# Patient Record
Sex: Female | Born: 1960 | Hispanic: No | Smoking: Never smoker
Health system: Southern US, Community
[De-identification: ages and names within clinical notes are randomized; demographics above are authoritative.]

## PROBLEM LIST (undated history)

## (undated) DIAGNOSIS — Z8619 Personal history of other infectious and parasitic diseases: Secondary | ICD-10-CM

## (undated) DIAGNOSIS — T7840XA Allergy, unspecified, initial encounter: Secondary | ICD-10-CM

## (undated) DIAGNOSIS — K635 Polyp of colon: Secondary | ICD-10-CM

## (undated) HISTORY — DX: Polyp of colon: K63.5

## (undated) HISTORY — DX: Personal history of other infectious and parasitic diseases: Z86.19

## (undated) HISTORY — PX: WISDOM TOOTH EXTRACTION: SHX21

## (undated) HISTORY — PX: ROBOTIC ASSISTED LAPAROSCOPIC HYSTERECTOMY AND SALPINGECTOMY: SHX6379

## (undated) HISTORY — PX: UMBILICAL HERNIA REPAIR: SHX196

## (undated) HISTORY — PX: HERNIA REPAIR: SHX51

## (undated) HISTORY — PX: ABDOMINAL HYSTERECTOMY: SHX81

## (undated) HISTORY — DX: Allergy, unspecified, initial encounter: T78.40XA

---

## 2012-12-23 LAB — HM COLONOSCOPY

## 2015-04-17 LAB — HM MAMMOGRAPHY

## 2017-10-19 ENCOUNTER — Other Ambulatory Visit: Payer: Self-pay

## 2017-10-19 ENCOUNTER — Encounter: Payer: Self-pay | Admitting: Family Medicine

## 2017-10-19 ENCOUNTER — Ambulatory Visit: Payer: BC Managed Care – PPO | Admitting: Family Medicine

## 2017-10-19 VITALS — BP 108/78 | HR 86 | Temp 98.1°F | Resp 14 | Ht 67.0 in | Wt 173.0 lb

## 2017-10-19 DIAGNOSIS — M79671 Pain in right foot: Secondary | ICD-10-CM | POA: Diagnosis not present

## 2017-10-19 DIAGNOSIS — R5383 Other fatigue: Secondary | ICD-10-CM

## 2017-10-19 DIAGNOSIS — M79672 Pain in left foot: Secondary | ICD-10-CM | POA: Diagnosis not present

## 2017-10-19 LAB — CBC WITH DIFFERENTIAL/PLATELET
BASOS ABS: 0 10*3/uL (ref 0.0–0.1)
Basophils Relative: 0.9 % (ref 0.0–3.0)
EOS ABS: 0.2 10*3/uL (ref 0.0–0.7)
Eosinophils Relative: 4.5 % (ref 0.0–5.0)
HCT: 40.6 % (ref 36.0–46.0)
Hemoglobin: 13.3 g/dL (ref 12.0–15.0)
LYMPHS ABS: 1.6 10*3/uL (ref 0.7–4.0)
Lymphocytes Relative: 35.5 % (ref 12.0–46.0)
MCHC: 32.7 g/dL (ref 30.0–36.0)
MCV: 84.6 fl (ref 78.0–100.0)
MONO ABS: 0.4 10*3/uL (ref 0.1–1.0)
MONOS PCT: 9 % (ref 3.0–12.0)
NEUTROS PCT: 50.1 % (ref 43.0–77.0)
Neutro Abs: 2.2 10*3/uL (ref 1.4–7.7)
Platelets: 261 10*3/uL (ref 150.0–400.0)
RBC: 4.8 Mil/uL (ref 3.87–5.11)
RDW: 14 % (ref 11.5–15.5)
WBC: 4.4 10*3/uL (ref 4.0–10.5)

## 2017-10-19 LAB — BASIC METABOLIC PANEL
BUN: 11 mg/dL (ref 6–23)
CO2: 27 mEq/L (ref 19–32)
CREATININE: 0.83 mg/dL (ref 0.40–1.20)
Calcium: 9.3 mg/dL (ref 8.4–10.5)
Chloride: 104 mEq/L (ref 96–112)
GFR: 75.9 mL/min (ref >60.00)
Glucose, Bld: 101 mg/dL — ABNORMAL HIGH (ref 70–99)
POTASSIUM: 4.5 meq/L (ref 3.5–5.1)
Sodium: 138 mEq/L (ref 135–145)

## 2017-10-19 LAB — VITAMIN D 25 HYDROXY (VIT D DEFICIENCY, FRACTURES): VITD: 10.53 ng/mL — AB (ref 30.00–100.00)

## 2017-10-19 LAB — TSH: TSH: 0.46 u[IU]/mL (ref 0.35–4.50)

## 2017-10-19 NOTE — Progress Notes (Signed)
   Subjective:    Patient ID: Hannah Ayala, female    DOB: Nov 28, 1960, 56 y.o.   MRN: [id number]  HPI New to establish.  Previous MD- [place], [place]  GYN- [facility].  UTD on pap, mammo, colonoscopy (done in [place]- moved 2 yrs ago 2016)  + bilateral foot pain over the heel after prolonged sitting- pain improves with moving around after 5 minutes.  Worse w/ heels.  Fatigue- pt is able to fall asleep very easily upon sitting, particularly after eating.  Admits to high carb diet.  Sleeping well at night but sleep is fragmented- will fall asleep after work, will sleep until 11 and then wake up for 2-3 hrs before going back to sleep.  Pt has been told that she snores but no breathing pauses.  Review of Systems For ROS see HPI      Objective:   Physical Exam  Constitutional: She is oriented to person, place, and time. She appears well-developed and well-nourished. No distress.  HENT:  Head: Normocephalic and atraumatic.  Eyes: Conjunctivae and EOM are normal. Pupils are equal, round, and reactive to light.  Neck: Normal range of motion. Neck supple. No thyromegaly present.  Cardiovascular: Normal rate, regular rhythm, normal heart sounds and intact distal pulses.  No murmur heard. Pulmonary/Chest: Effort normal and breath sounds normal. No respiratory distress.  Abdominal: Soft. She exhibits no distension. There is no tenderness.  Musculoskeletal: She exhibits tenderness (mild TTP over achilles bilaterally and over insertion of plantar fascia). She exhibits no edema.  Lymphadenopathy:    She has no cervical adenopathy.  Neurological: She is alert and oriented to person, place, and time.  Skin: Skin is warm and dry.  Psychiatric: She has a normal mood and affect. Her behavior is normal.  Vitals reviewed.         Assessment & Plan:  Bilateral foot pain- new.  Likely due to wearing high heels or other shoes w/ poor arch support and consistent w/ plantar fasciitis.  Reviewed dx w/  pt and tx- she was shown stretches in the office and we reviewed the importance of ice and NSAIDs.  Discussed need for supportive footwear but pt is not interested in changing her shoes at this time.  Will follow and if no improvement will refer to podiatry.  Pt expressed understanding and is in agreement w/ plan.   Post-prandial fatigue- new.  Suspect this is due to pt's high carb intake and the subsequent 'crash' afterwards.  Discussed this at length w/ pt and need to modify her dietary habits.  Check labs to r/o metabolic cause of fatigue.  Will follow.

## 2017-10-19 NOTE — Patient Instructions (Signed)
Schedule your complete physical in 6 months We'll notify you of your lab results and make any changes if needed The foot pain sounds like plantar fasciitis and is inflammation that is common when wearing shoes w/ poor arch support (almost all women's shoes!).  Do the stretches- push against the wall or drop down off a step- to help with pain and stiffness.  ICE!  If no improvement, let me know! The fatigue sounds like a carb crash- common after starchy things or sugary things.  Limit your carb intake, increase your protein and water intake to help combat fatigue after eating. Call with any questions or concerns Welcome!  We're glad to have you! Happy New Year!!!

## 2017-10-20 ENCOUNTER — Other Ambulatory Visit: Payer: Self-pay | Admitting: Family Medicine

## 2017-10-20 DIAGNOSIS — E559 Vitamin D deficiency, unspecified: Secondary | ICD-10-CM

## 2017-10-20 MED ORDER — VITAMIN D (ERGOCALCIFEROL) 1.25 MG (50000 UNIT) PO CAPS: 50000.0000 [IU] | ORAL_CAPSULE | ORAL | 0 refills | Status: DC

## 2017-10-21 ENCOUNTER — Encounter: Payer: Self-pay | Admitting: Emergency Medicine

## 2018-04-21 ENCOUNTER — Encounter: Payer: Self-pay | Admitting: Family Medicine

## 2018-04-21 ENCOUNTER — Other Ambulatory Visit: Payer: Self-pay

## 2018-04-21 ENCOUNTER — Ambulatory Visit (INDEPENDENT_AMBULATORY_CARE_PROVIDER_SITE_OTHER): Payer: BC Managed Care – PPO | Admitting: Family Medicine

## 2018-04-21 VITALS — BP 114/72 | HR 80 | Temp 97.7°F | Resp 15 | Ht 66.0 in | Wt 176.4 lb

## 2018-04-21 DIAGNOSIS — E559 Vitamin D deficiency, unspecified: Secondary | ICD-10-CM | POA: Diagnosis not present

## 2018-04-21 DIAGNOSIS — Z0000 Encounter for general adult medical examination without abnormal findings: Secondary | ICD-10-CM | POA: Diagnosis not present

## 2018-04-21 DIAGNOSIS — E785 Hyperlipidemia, unspecified: Secondary | ICD-10-CM | POA: Diagnosis not present

## 2018-04-21 DIAGNOSIS — Z87448 Personal history of other diseases of urinary system: Secondary | ICD-10-CM

## 2018-04-21 LAB — URINALYSIS, ROUTINE W REFLEX MICROSCOPIC
Bilirubin Urine: NEGATIVE
Ketones, ur: NEGATIVE
LEUKOCYTES UA: NEGATIVE
Nitrite: NEGATIVE
Total Protein, Urine: NEGATIVE
URINE GLUCOSE: NEGATIVE
UROBILINOGEN UA: 0.2 (ref 0.0–1.0)
pH: 6 (ref 5.0–8.0)

## 2018-04-21 LAB — POCT URINALYSIS DIPSTICK
Bilirubin, UA: NEGATIVE
Glucose, UA: NEGATIVE
KETONES UA: NEGATIVE
Leukocytes, UA: NEGATIVE
NITRITE UA: NEGATIVE
PH UA: 6 (ref 5.0–8.0)
PROTEIN UA: NEGATIVE
Spec Grav, UA: >=1.03 — AB (ref 1.010–1.025)
UROBILINOGEN UA: 0.2 U/dL

## 2018-04-21 LAB — LIPID PANEL
Cholesterol: 168 mg/dL (ref 0–200)
HDL: 46.6 mg/dL (ref >39.00)
LDL Cholesterol: 107 mg/dL — ABNORMAL HIGH (ref 0–99)
NONHDL: 121.51
Total CHOL/HDL Ratio: 4
Triglycerides: 75 mg/dL (ref 0.0–149.0)
VLDL: 15 mg/dL (ref 0.0–40.0)

## 2018-04-21 LAB — CBC WITH DIFFERENTIAL/PLATELET
BASOS ABS: 0 10*3/uL (ref 0.0–0.1)
Basophils Relative: 0.7 % (ref 0.0–3.0)
Eosinophils Absolute: 0.2 10*3/uL (ref 0.0–0.7)
Eosinophils Relative: 2.7 % (ref 0.0–5.0)
HEMATOCRIT: 39.4 % (ref 36.0–46.0)
Hemoglobin: 13.3 g/dL (ref 12.0–15.0)
LYMPHS PCT: 22.1 % (ref 12.0–46.0)
Lymphs Abs: 1.4 10*3/uL (ref 0.7–4.0)
MCHC: 33.6 g/dL (ref 30.0–36.0)
MCV: 83.4 fl (ref 78.0–100.0)
MONOS PCT: 9.3 % (ref 3.0–12.0)
Monocytes Absolute: 0.6 10*3/uL (ref 0.1–1.0)
Neutro Abs: 4 10*3/uL (ref 1.4–7.7)
Neutrophils Relative %: 65.2 % (ref 43.0–77.0)
Platelets: 243 10*3/uL (ref 150.0–400.0)
RBC: 4.73 Mil/uL (ref 3.87–5.11)
RDW: 14.8 % (ref 11.5–15.5)
WBC: 6.1 10*3/uL (ref 4.0–10.5)

## 2018-04-21 LAB — BASIC METABOLIC PANEL
BUN: 10 mg/dL (ref 6–23)
CALCIUM: 9.4 mg/dL (ref 8.4–10.5)
CO2: 26 meq/L (ref 19–32)
Chloride: 104 mEq/L (ref 96–112)
Creatinine, Ser: 0.72 mg/dL (ref 0.40–1.20)
GFR: 89.27 mL/min (ref >60.00)
Glucose, Bld: 93 mg/dL (ref 70–99)
Potassium: 4.2 mEq/L (ref 3.5–5.1)
SODIUM: 138 meq/L (ref 135–145)

## 2018-04-21 LAB — HEPATIC FUNCTION PANEL
ALBUMIN: 4.4 g/dL (ref 3.5–5.2)
ALT: 9 U/L (ref 0–35)
AST: 10 U/L (ref 0–37)
Alkaline Phosphatase: 98 U/L (ref 39–117)
Bilirubin, Direct: 0.2 mg/dL (ref 0.0–0.3)
TOTAL PROTEIN: 6.6 g/dL (ref 6.0–8.3)
Total Bilirubin: 1.1 mg/dL (ref 0.2–1.2)

## 2018-04-21 LAB — VITAMIN D 25 HYDROXY (VIT D DEFICIENCY, FRACTURES): VITD: 18.44 ng/mL — ABNORMAL LOW (ref 30.00–100.00)

## 2018-04-21 LAB — TSH: TSH: 0.56 u[IU]/mL (ref 0.35–4.50)

## 2018-04-21 NOTE — Assessment & Plan Note (Signed)
Check labs.  Replete prn. 

## 2018-04-21 NOTE — Progress Notes (Signed)
   Subjective:    Patient ID: Hannah Ayala, female    DOB: [birth year], 57 y.o.   MRN: [id number]  HPI CPE- UTD on colonoscopy, mammo.  Sees Dr Leo Grosser  L shoulder pain- 'I don't have the same reach that I used to'.  Having pain w/ internal rotation.  No pain w/ overhead motion.  Pt is not interested in further evaluation at this time.   Review of Systems Patient reports no vision/ hearing changes, adenopathy,fever, weight change,  persistant/recurrent hoarseness , swallowing issues, chest pain, palpitations, edema, persistant/recurrent cough, hemoptysis, dyspnea (rest/exertional/paroxysmal nocturnal), gastrointestinal bleeding (melena, rectal bleeding), abdominal pain, significant heartburn, bowel changes, GU symptoms (dysuria, hematuria, incontinence), Gyn symptoms (abnormal  bleeding, pain),  syncope, focal weakness, memory loss, numbness & tingling, skin/hair/nail changes, abnormal bruising or bleeding, anxiety, or depression.     Objective:   Physical Exam General Appearance:    Alert, cooperative, no distress, appears stated age  Head:    Normocephalic, without obvious abnormality, atraumatic  Eyes:    PERRL, conjunctiva/corneas clear, EOM's intact, fundi    benign, both eyes  Ears:    Normal TM's and external ear canals, both ears  Nose:   Nares normal, septum midline, mucosa normal, no drainage    or sinus tenderness  Throat:   Lips, mucosa, and tongue normal; teeth and gums normal  Neck:   Supple, symmetrical, trachea midline, no adenopathy;    Thyroid: no enlargement/tenderness/nodules  Back:     Symmetric, no curvature, ROM normal, no CVA tenderness  Lungs:     Clear to auscultation bilaterally, respirations unlabored  Chest Wall:    No tenderness or deformity   Heart:    Regular rate and rhythm, S1 and S2 normal, no murmur, rub   or gallop  Breast Exam:    Deferred to GYN  Abdomen:     Soft, non-tender, bowel sounds active all four quadrants,    no masses, no  organomegaly  Genitalia:    Deferred to GYN  Rectal:    Extremities:   Extremities normal, atraumatic, no cyanosis or edema  Pulses:   2+ and symmetric all extremities  Skin:   Skin color, texture, turgor normal, no rashes or lesions  Lymph nodes:   Cervical, supraclavicular, and axillary nodes normal  Neurologic:   CNII-XII intact, normal strength, sensation and reflexes    throughout          Assessment & Plan:

## 2018-04-21 NOTE — Addendum Note (Signed)
Addended by: Katina Dung on: 04/21/2018 01:58 PM   Modules accepted: Orders

## 2018-04-21 NOTE — Patient Instructions (Signed)
Follow up in 1 year or as needed We'll notify you of your lab results and make any changes if needed Continue to work on healthy diet and regular exercise- you can do it! Please have Dr Leo Grosser send me a copy of her notes Call with any questions or concerns Have a great summer!!

## 2018-04-21 NOTE — Addendum Note (Signed)
Addended by: Katina Dung on: 04/21/2018 02:09 PM   Modules accepted: Orders

## 2018-04-21 NOTE — Assessment & Plan Note (Signed)
Pt's PE WNL.  UTD on Tdap, mammo, colonoscopy.  Check labs.  Anticipatory guidance provided.

## 2018-04-22 LAB — URINE CULTURE
MICRO NUMBER:: 90775418
Result:: NO GROWTH
SPECIMEN QUALITY: ADEQUATE

## 2018-04-24 ENCOUNTER — Other Ambulatory Visit: Payer: Self-pay

## 2018-04-24 MED ORDER — VITAMIN D (ERGOCALCIFEROL) 1.25 MG (50000 UNIT) PO CAPS: 50000.0000 [IU] | ORAL_CAPSULE | ORAL | 0 refills | Status: DC

## 2018-06-06 LAB — HM PAP SMEAR

## 2018-06-06 LAB — HM MAMMOGRAPHY: HM Mammogram: NORMAL (ref 0–4)

## 2018-06-13 ENCOUNTER — Encounter: Payer: Self-pay | Admitting: General Practice

## 2018-06-27 ENCOUNTER — Encounter: Payer: Self-pay | Admitting: Pulmonary Disease

## 2018-06-27 ENCOUNTER — Ambulatory Visit: Payer: BC Managed Care – PPO | Admitting: Pulmonary Disease

## 2018-06-27 VITALS — BP 100/78 | HR 87 | Ht 67.0 in | Wt 174.2 lb

## 2018-06-27 DIAGNOSIS — R0683 Snoring: Secondary | ICD-10-CM | POA: Diagnosis not present

## 2018-06-27 DIAGNOSIS — R400 Somnolence: Secondary | ICD-10-CM

## 2018-06-27 NOTE — Patient Instructions (Signed)
Excessive daytime sleepiness  History of snoring  Recent weight gain  Mild to moderate probability of significant sleep disordered breathing   Optimize sleep hygiene  Avoid daytime naps  Try and obtain a consolidated 7 to 8 hours of sleep at night, set a definite wake-up time  We will obtain a home sleep study  If home sleep study is negative and symptoms are not improving then we may need an MSLT  Encouraged regular exercises  I will see you back in the office in about 6 weeks

## 2018-06-27 NOTE — Progress Notes (Signed)
Hannah Ayala    [id number]    Jan 16, 1961  Primary Care Physician:Tabori, Aundra Millet, MD  Referring Physician: Midge Minium, MD [address] [place], [place] [address]  Chief complaint:   Excessive daytime sleepiness History of snoring  HPI: About 10-year history of progressive daytime sleepiness Tries to get up about 5 AM every morning Not able to stay awake following finishing work about 6 PM Will usually fall asleep soon after getting home if she sits in one position May sleep for about 2 to 3 hours-will wake up about 11-12PM, it might take another 2 to 3 hours to fall back asleep Tries to get up about 5-6 AM Has a lot of sleep inertia Admits to snoring No morning headaches, no dryness of the mouth Has some problems with her memory Does not exercise on a regular basis She holds a desk job Has gained about 20-30 pounds in the last 3 years No family history of diagnosed sleep disorder  Denies sleep paralysis, no cataplexy, no hallucinations  Pets: Has a pet dog, does not get woken up by the dog Occupation: Office work, runs in education program Smoking history: Never smoker  Outpatient Encounter Medications as of 06/27/2018  Medication Sig  . Vitamin D, Ergocalciferol, (DRISDOL) 50000 units CAPS capsule Take 1 capsule (50,000 Units total) by mouth every 7 (seven) days.   No facility-administered encounter medications on file as of 06/27/2018.     Allergies as of 06/27/2018 - Review Complete 06/27/2018  Allergen Reaction Noted  . Penicillin g Other (See Comments) 06/14/2016    Past Medical History:  Diagnosis Date  . Benign colonic polyp   . History of chickenpox     Past Surgical History:  Procedure Laterality Date  . ABDOMINAL HYSTERECTOMY    . HERNIA REPAIR     umbilical    Family History  Problem Relation Age of Onset  . Asthma Mother   . Hyperlipidemia Mother   . Cancer Father        Prostate  . Diabetes Father   . Hearing  loss Father   . Hyperlipidemia Father   . Hypertension Father   . Asthma Maternal Grandmother   . Diabetes Maternal Grandmother   . Heart disease Maternal Grandfather   . Heart attack Maternal Grandfather   . Early death Paternal Grandmother   . Cancer Paternal Grandfather     Social History   Socioeconomic History  . Marital status: Unknown    Spouse name: Not on file  . Number of children: Not on file  . Years of education: Not on file  . Highest education level: Not on file  Occupational History  . Not on file  Social Needs  . Financial resource strain: Not on file  . Food insecurity:    Worry: Not on file    Inability: Not on file  . Transportation needs:    Medical: Not on file    Non-medical: Not on file  Tobacco Use  . Smoking status: Never Smoker  . Smokeless tobacco: Never Used  Substance and Sexual Activity  . Alcohol use: No    Frequency: Never  . Drug use: No  . Sexual activity: Never  Lifestyle  . Physical activity:    Days per week: Not on file    Minutes per session: Not on file  . Stress: Not on file  Relationships  . Social connections:    Talks on phone:  Not on file    Gets together: Not on file    Attends religious service: Not on file    Active member of club or organization: Not on file    Attends meetings of clubs or organizations: Not on file    Relationship status: Not on file  . Intimate partner violence:    Fear of current or ex partner: Not on file    Emotionally abused: Not on file    Physically abused: Not on file    Forced sexual activity: Not on file  Other Topics Concern  . Not on file  Social History Narrative  . Not on file    Review of Systems  Constitutional: Negative for activity change.  Respiratory: Negative for apnea.        Snoring  Cardiovascular: Negative.   Gastrointestinal: Negative.   Musculoskeletal: Negative.   Skin: Negative.   Psychiatric/Behavioral: Positive for sleep disturbance.    Vitals:    06/27/18 0941  BP: 100/78  Pulse: 87  SpO2: 98%     Physical Exam  Constitutional: She is oriented to person, place, and time. She appears well-developed and well-nourished.  HENT:  Head: Normocephalic.  Mallampati 2  Eyes: Pupils are equal, round, and reactive to light. Conjunctivae and EOM are normal. Right eye exhibits no discharge. Left eye exhibits no discharge.  Neck: Normal range of motion. Neck supple. No tracheal deviation present. No thyromegaly present.  Cardiovascular: Normal rate and regular rhythm.  Pulmonary/Chest: Effort normal and breath sounds normal. No respiratory distress. She has no wheezes.  Abdominal: Soft. Bowel sounds are normal. She exhibits no distension. There is no tenderness.  Musculoskeletal: She exhibits no edema.  Neurological: She is alert and oriented to person, place, and time. She has normal reflexes. No cranial nerve deficit.  Skin: Skin is warm and dry. No erythema.  Psychiatric: She has a normal mood and affect. Her behavior is normal.   No significant labs  Assessment:  Moderate probability of significant sleep disordered breathing  Daytime sleepiness related to sleep hygiene  Snoring  Plan/Recommendations:  Optimize sleep hygiene  Obtain a polysomnogram to rule out significant sleep disordered breathing-we will order split-night study  Pathophysiology of sleep disordered breathing,  Treatment options of sleep disordered breathing also discussed  I will see her back in the office about 6 to 8 weeks following initiation of treatment if needed  Avoid daytime naps  Regular exercises encouraged  Sherrilyn Rist MD [facility] 06/27/2018, 12:18 PM  CC: Midge Minium, MD

## 2018-07-25 ENCOUNTER — Other Ambulatory Visit: Payer: Self-pay | Admitting: Family Medicine

## 2018-07-25 ENCOUNTER — Ambulatory Visit (HOSPITAL_BASED_OUTPATIENT_CLINIC_OR_DEPARTMENT_OTHER): Payer: BC Managed Care – PPO | Attending: Pulmonary Disease | Admitting: Pulmonary Disease

## 2018-07-25 DIAGNOSIS — G4733 Obstructive sleep apnea (adult) (pediatric): Secondary | ICD-10-CM | POA: Insufficient documentation

## 2018-07-25 DIAGNOSIS — R400 Somnolence: Secondary | ICD-10-CM | POA: Diagnosis present

## 2018-07-31 ENCOUNTER — Telehealth: Payer: Self-pay | Admitting: Pulmonary Disease

## 2018-07-31 NOTE — Telephone Encounter (Signed)
Patient is aware of these results and nothing further is needed at this time. 

## 2018-07-31 NOTE — Procedures (Signed)
POLYSOMNOGRAPHY  Last, First: Hannah Ayala, Hannah Ayala MRN: [id number] Gender: Female Age (years): 66 Weight (lbs): 170.40 DOB: [birth year] BMI: 28 Primary Care: Midge Minium Epworth Score: 22 Referring: Laurin Coder MD Technician: Earney Hamburg Interpreting: Laurin Coder MD Study Type: NPSG Ordered Study Type: Split Night CPAP Study date: 07/25/2018 Location: [address] CLINICAL INFORMATION Hannah Ayala is a 57 year old Female and was referred to the sleep center for evaluation of N/A. Indications include Excessive Daytime Sleepiness.  MEDICATIONS Patient self administered medications include: N/A. Medications administered during study include No sleep medicine administered.  SLEEP STUDY TECHNIQUE A multi-channel overnight Polysomnography study was performed. The channels recorded and monitored were central and occipital EEG, electrooculogram (EOG), submentalis EMG (chin), nasal and oral airflow, thoracic and abdominal wall motion, anterior tibialis EMG, snore microphone, electrocardiogram, and a pulse oximetry. TECHNICIAN COMMENTS Comments added by Technician: NONE Comments added by Scorer: N/A SLEEP ARCHITECTURE The study was initiated at 10:12:26 PM and terminated at 4:29:25 AM. The total recorded time was 377 minutes. EEG confirmed total sleep time was 299.5 minutes yielding a sleep efficiency of 79.4%%. Sleep onset after lights out was 27.9 minutes with a REM latency of 311.5 minutes. The patient spent 1.3%% of the night in stage N1 sleep, 89.5%% in stage N2 sleep, 0.0%% in stage N3 and 9.2% in REM. Wake after sleep onset (WASO) was 49.6 minutes. The Arousal Index was 7.4/hour. RESPIRATORY PARAMETERS There were a total of 13 respiratory disturbances out of which 3 were apneas ( 3 obstructive, 0 mixed, 0 central) and 10 hypopneas. The apnea/hypopnea index (AHI) was 2.6 events/hour. The central sleep apnea index was 0.0 events/hour. The REM AHI was 0.0 events/hour  and NREM AHI was 2.9 events/hour. The supine AHI was 5.4 events/hour and the non supine AHI was 0 supine during 47.91% of sleep. Respiratory disturbances were associated with oxygen desaturation down to a nadir of 89.0% during sleep. The mean oxygen saturation during the study was 93.8%. The cumulative time under 88% oxygen saturation was 5.5 minutes.  LEG MOVEMENT DATA The total leg movements were 0 with a resulting leg movement index of 0.0/hr .Associated arousal with leg movement index was 0.0/hr.  CARDIAC DATA The underlying cardiac rhythm was most consistent with sinus rhythm. Mean heart rate during sleep was 78.2 bpm. Additional rhythm abnormalities include None.   IMPRESSIONS - No Significant Obstructive Sleep apnea(OSA) - EKG showed no cardiac abnormalities. - Mild Oxygen Desaturation - The patient snored with loud snoring volume. - No significant periodic leg movements(PLMs) during sleep. However, no significant associated arousals.   DIAGNOSIS - Nocturnal Hypoxemia (327.26 [G47.36 ICD-10])   RECOMMENDATIONS - Avoid alcohol, sedatives and other CNS depressants that may worsen sleep apnea and disrupt normal sleep architecture. - Sleep hygiene should be reviewed to assess factors that may improve sleep quality. - Weight management and regular exercise should be initiated or continued. - Current study does not suggest the presence of significant sleep disordered breathing, behavioral modifications that support and optimize sleep hygiene and an adequate amout of sleep recommended. - Desaturations was very mild, no treatment indicated at present.  [Electronically signed] 07/31/2018 06:46 AM  Sherrilyn Rist MD NPI: [id number]

## 2018-07-31 NOTE — Telephone Encounter (Signed)
Sleep study result  Negative study for significant sleep disordered breathing Very mild oxygen desaturations-does not need treated at present  Optimize sleep hygiene Optimize hours of sleep-at least 7 hours of sleep recommended Avoid daytime naps Avoid medications that may contribute to daytime sleepiness.

## 2019-02-20 ENCOUNTER — Other Ambulatory Visit: Payer: Self-pay

## 2019-02-20 ENCOUNTER — Encounter: Payer: Self-pay | Admitting: Family Medicine

## 2019-02-20 ENCOUNTER — Ambulatory Visit (INDEPENDENT_AMBULATORY_CARE_PROVIDER_SITE_OTHER): Payer: BC Managed Care – PPO | Admitting: Family Medicine

## 2019-02-20 VITALS — Ht 66.0 in | Wt 171.0 lb

## 2019-02-20 DIAGNOSIS — M25531 Pain in right wrist: Secondary | ICD-10-CM

## 2019-02-20 MED ORDER — MELOXICAM 15 MG PO TABS: 15.0000 mg | ORAL_TABLET | Freq: Every day | ORAL | 0 refills | Status: DC

## 2019-02-20 NOTE — Progress Notes (Signed)
   Virtual Visit via Video   I connected with patient on 02/20/19 at 11:40 AM EDT by a video enabled telemedicine application and verified that I am speaking with the correct person using two identifiers.  Location patient: Home Location provider: Acupuncturist, Office Persons participating in the virtual visit: Patient, Provider, Avinger (Jess B)  I discussed the limitations of evaluation and management by telemedicine and the availability of in person appointments. The patient expressed understanding and agreed to proceed.  Subjective:   HPI:   R wrist pain- pt has been working from home, typing.  Developed a sharp pain over R CMC joint.  No known injury.  Had similar episode in grocery store in Feb.  Last night took Tylenol, used Aspercreme, and heat.  R hand dominant.  Pt knits regularly.  ROS:   See pertinent positives and negatives per HPI.  Patient Active Problem List   Diagnosis Date Noted  . Physical exam 04/21/2018  . Vitamin D deficiency 04/21/2018    Social History   Tobacco Use  . Smoking status: Never Smoker  . Smokeless tobacco: Never Used  Substance Use Topics  . Alcohol use: No    Frequency: Never   No current outpatient medications on file.  Allergies  Allergen Reactions  . Penicillin G Other (See Comments)    uknown    Objective:   Ht 5\' 6"  (1.676 m)   Wt 171 lb (77.6 kg)   BMI 27.60 kg/m   AAOx3, NAD NCAT, EOMI No obvious CN deficits Coloring WNL Pain over R CMC joint Pt is able to speak clearly, coherently without shortness of breath or increased work of breathing.  Thought process is linear.  Mood is appropriate.   Assessment and Plan:   R wrist pain- new.  Suspect inflammation of CMC joint.  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  Start scheduled NSAIDs.  Ice.  If no improvement will needs sports med referral.  Pt expressed understanding and is in agreement w/ plan.     Annye Asa, MD 02/20/2019

## 2019-03-23 ENCOUNTER — Telehealth: Payer: Self-pay | Admitting: Family Medicine

## 2019-03-23 DIAGNOSIS — Z0100 Encounter for examination of eyes and vision without abnormal findings: Secondary | ICD-10-CM

## 2019-03-23 NOTE — Telephone Encounter (Signed)
Spoke with pt, this is for a routine eye exam. Referral placed.

## 2019-03-23 NOTE — Telephone Encounter (Signed)
Copied from [place] [phone]. Topic: Referral - Request for Referral >> Mar 23, 2019  1:23 PM Blase Mess A wrote: Has patient seen PCP for this complaint? NO *If NO, is insurance requiring patient see PCP for this issue before PCP can refer them? no Referral for which specialty: Eye doctor Preferred provider/office: It does not matter in [facility] or [place] Reason for referral: Patient is going to [facility] dr. Not a good experience.

## 2019-03-23 NOTE — Telephone Encounter (Signed)
Ok for referral?

## 2019-03-23 NOTE — Telephone Encounter (Signed)
[facility] for referral.  If routine eye exam- optometry.  If any concerns- ophthalmology

## 2019-05-02 ENCOUNTER — Encounter: Payer: Self-pay | Admitting: Family Medicine

## 2019-05-02 ENCOUNTER — Ambulatory Visit (INDEPENDENT_AMBULATORY_CARE_PROVIDER_SITE_OTHER): Payer: BC Managed Care – PPO | Admitting: Family Medicine

## 2019-05-02 ENCOUNTER — Other Ambulatory Visit: Payer: Self-pay

## 2019-05-02 VITALS — BP 110/76 | HR 77 | Temp 98.0°F | Resp 16 | Ht 66.0 in | Wt 175.0 lb

## 2019-05-02 DIAGNOSIS — Z87448 Personal history of other diseases of urinary system: Secondary | ICD-10-CM | POA: Diagnosis not present

## 2019-05-02 DIAGNOSIS — E663 Overweight: Secondary | ICD-10-CM | POA: Diagnosis not present

## 2019-05-02 DIAGNOSIS — Z0000 Encounter for general adult medical examination without abnormal findings: Secondary | ICD-10-CM | POA: Diagnosis not present

## 2019-05-02 DIAGNOSIS — E669 Obesity, unspecified: Secondary | ICD-10-CM | POA: Insufficient documentation

## 2019-05-02 DIAGNOSIS — Z01419 Encounter for gynecological examination (general) (routine) without abnormal findings: Secondary | ICD-10-CM

## 2019-05-02 DIAGNOSIS — R82998 Other abnormal findings in urine: Secondary | ICD-10-CM

## 2019-05-02 DIAGNOSIS — E559 Vitamin D deficiency, unspecified: Secondary | ICD-10-CM | POA: Diagnosis not present

## 2019-05-02 LAB — POCT URINALYSIS DIPSTICK
Bilirubin, UA: NEGATIVE
Blood, UA: NEGATIVE
Glucose, UA: NEGATIVE
Nitrite, UA: NEGATIVE
Protein, UA: POSITIVE — AB
Spec Grav, UA: 1.015 (ref 1.010–1.025)
Urobilinogen, UA: 0.2 E.U./dL
pH, UA: 6.5 (ref 5.0–8.0)

## 2019-05-02 NOTE — Assessment & Plan Note (Signed)
Pt has hx of this.  Check labs and replete prn. 

## 2019-05-02 NOTE — Progress Notes (Signed)
   Subjective:    Patient ID: Hannah Ayala, female    DOB: [birth year], 58 y.o.   MRN: [id number]  HPI CPE- UTD on colonoscopy, mammo, pap.  Needs new referral to GYN b/c Dr Leo Grosser retired.   Review of Systems Patient reports no vision/ hearing changes, adenopathy,fever, weight change,  persistant/recurrent hoarseness , swallowing issues, chest pain, palpitations, edema, persistant/recurrent cough, hemoptysis, dyspnea (rest/exertional/paroxysmal nocturnal), gastrointestinal bleeding (melena, rectal bleeding), abdominal pain, significant heartburn, bowel changes, GU symptoms (dysuria, hematuria, incontinence), Gyn symptoms (abnormal  bleeding, pain),  syncope, focal weakness, memory loss, numbness & tingling, skin/hair/nail changes, abnormal bruising or bleeding, anxiety, or depression.     Objective:   Physical Exam General Appearance:    Alert, cooperative, no distress, appears stated age  Head:    Normocephalic, without obvious abnormality, atraumatic  Eyes:    PERRL, conjunctiva/corneas clear, EOM's intact, fundi    benign, both eyes  Ears:    Normal TM's and external ear canals, both ears  Nose:   Nares normal, septum midline, mucosa normal, no drainage    or sinus tenderness  Throat:   Lips, mucosa, and tongue normal; teeth and gums normal  Neck:   Supple, symmetrical, trachea midline, no adenopathy;    Thyroid: no enlargement/tenderness/nodules  Back:     Symmetric, no curvature, ROM normal, no CVA tenderness  Lungs:     Clear to auscultation bilaterally, respirations unlabored  Chest Wall:    No tenderness or deformity   Heart:    Regular rate and rhythm, S1 and S2 normal, no murmur, rub   or gallop  Breast Exam:    Deferred to GYN  Abdomen:     Soft, non-tender, bowel sounds active all four quadrants,    no masses, no organomegaly  Genitalia:    Deferred to GYN  Rectal:    Extremities:   Extremities normal, atraumatic, no cyanosis or edema  Pulses:   2+ and symmetric all  extremities  Skin:   Skin color, texture, turgor normal, no rashes or lesions  Lymph nodes:   Cervical, supraclavicular, and axillary nodes normal  Neurologic:   CNII-XII intact, normal strength, sensation and reflexes    throughout          Assessment & Plan:

## 2019-05-02 NOTE — Assessment & Plan Note (Signed)
Pt's PE WNL w/ exception of being overweight.  Needs GYN referral since MD retired.  UTD on colonoscopy.  Check labs.  Anticipatory guidance provided.

## 2019-05-02 NOTE — Patient Instructions (Signed)
Follow up in 1 year or as needed We'll notify you of your lab results and make any changes if needed Continue to work on healthy diet and regular exercise- you can do it!!! We'll call you with your GYN appt Call with any questions or concerns Stay Safe!!!

## 2019-05-02 NOTE — Assessment & Plan Note (Signed)
Ongoing issue for pt.  Stressed need for healthy diet and regular exercise.  Check labs to risk stratify.  Will follow 

## 2019-05-03 ENCOUNTER — Other Ambulatory Visit: Payer: Self-pay | Admitting: General Practice

## 2019-05-03 LAB — CBC WITH DIFFERENTIAL/PLATELET
Absolute Monocytes: 508 cells/uL (ref 200–950)
Basophils Absolute: 32 cells/uL (ref 0–200)
Basophils Relative: 0.6 %
Eosinophils Absolute: 167 cells/uL (ref 15–500)
Eosinophils Relative: 3.1 %
HCT: 38.5 % (ref 35.0–45.0)
Hemoglobin: 13.1 g/dL (ref 11.7–15.5)
Lymphs Abs: 1550 cells/uL (ref 850–3900)
MCH: 27.5 pg (ref 27.0–33.0)
MCHC: 34 g/dL (ref 32.0–36.0)
MCV: 80.9 fL (ref 80.0–100.0)
MPV: 9.8 fL (ref 7.5–12.5)
Monocytes Relative: 9.4 %
Neutro Abs: 3143 cells/uL (ref 1500–7800)
Neutrophils Relative %: 58.2 %
Platelets: 322 10*3/uL (ref 140–400)
RBC: 4.76 10*6/uL (ref 3.80–5.10)
RDW: 14.9 % (ref 11.0–15.0)
Total Lymphocyte: 28.7 %
WBC: 5.4 10*3/uL (ref 3.8–10.8)

## 2019-05-03 LAB — BASIC METABOLIC PANEL
BUN: 15 mg/dL (ref 7–25)
CO2: 24 mmol/L (ref 20–32)
Calcium: 10 mg/dL (ref 8.6–10.4)
Chloride: 106 mmol/L (ref 98–110)
Creat: 0.82 mg/dL (ref 0.50–1.05)
Glucose, Bld: 91 mg/dL (ref 65–99)
Potassium: 4.2 mmol/L (ref 3.5–5.3)
Sodium: 140 mmol/L (ref 135–146)

## 2019-05-03 LAB — HEPATIC FUNCTION PANEL
AG Ratio: 1.8 (calc) (ref 1.0–2.5)
ALT: 10 U/L (ref 6–29)
AST: 14 U/L (ref 10–35)
Albumin: 4.6 g/dL (ref 3.6–5.1)
Alkaline phosphatase (APISO): 103 U/L (ref 37–153)
Bilirubin, Direct: 0.2 mg/dL (ref 0.0–0.2)
Globulin: 2.6 g/dL (calc) (ref 1.9–3.7)
Indirect Bilirubin: 0.8 mg/dL (calc) (ref 0.2–1.2)
Total Bilirubin: 1 mg/dL (ref 0.2–1.2)
Total Protein: 7.2 g/dL (ref 6.1–8.1)

## 2019-05-03 LAB — LIPID PANEL
Cholesterol: 183 mg/dL (ref <200)
HDL: 48 mg/dL — ABNORMAL LOW (ref >=50)
LDL Cholesterol (Calc): 118 mg/dL (calc) — ABNORMAL HIGH
Non-HDL Cholesterol (Calc): 135 mg/dL (calc) — ABNORMAL HIGH (ref <130)
Total CHOL/HDL Ratio: 3.8 (calc) (ref <5.0)
Triglycerides: 77 mg/dL (ref <150)

## 2019-05-03 LAB — VITAMIN D 25 HYDROXY (VIT D DEFICIENCY, FRACTURES): Vit D, 25-Hydroxy: 20 ng/mL — ABNORMAL LOW (ref 30–100)

## 2019-05-03 LAB — TSH: TSH: 0.54 mIU/L (ref 0.40–4.50)

## 2019-05-03 MED ORDER — VITAMIN D (ERGOCALCIFEROL) 1.25 MG (50000 UNIT) PO CAPS: 50000.0000 [IU] | ORAL_CAPSULE | ORAL | 0 refills | Status: DC

## 2019-05-04 ENCOUNTER — Encounter: Payer: BC Managed Care – PPO | Admitting: Family Medicine

## 2019-05-04 LAB — URINE CULTURE
MICRO NUMBER:: 646952
Result:: NO GROWTH
SPECIMEN QUALITY:: ADEQUATE

## 2019-06-29 ENCOUNTER — Ambulatory Visit: Payer: BC Managed Care – PPO | Admitting: Obstetrics & Gynecology

## 2019-07-16 ENCOUNTER — Other Ambulatory Visit: Payer: Self-pay

## 2019-07-17 ENCOUNTER — Encounter: Payer: Self-pay | Admitting: Obstetrics & Gynecology

## 2019-07-17 ENCOUNTER — Ambulatory Visit: Payer: BC Managed Care – PPO | Admitting: Obstetrics & Gynecology

## 2019-07-17 VITALS — BP 120/76 | Ht 66.0 in | Wt 175.0 lb

## 2019-07-17 DIAGNOSIS — Z01419 Encounter for gynecological examination (general) (routine) without abnormal findings: Secondary | ICD-10-CM | POA: Diagnosis not present

## 2019-07-17 DIAGNOSIS — Z90710 Acquired absence of both cervix and uterus: Secondary | ICD-10-CM | POA: Diagnosis not present

## 2019-07-17 DIAGNOSIS — E663 Overweight: Secondary | ICD-10-CM

## 2019-07-17 DIAGNOSIS — Z87448 Personal history of other diseases of urinary system: Secondary | ICD-10-CM | POA: Diagnosis not present

## 2019-07-17 NOTE — Progress Notes (Signed)
Hannah Ayala [birth year] [id number]   History:    58 y.o. G2P2L2 Divorced x many yrs.    RP:  New patient presenting for annual gyn exam   HPI: S/P Total Hysterectomy for Fibroids.  No pelvic pain.  No menopausal Sx.  Abstinent x many yrs.  Breasts normal.  Urine normal, h/o hematuria.  BMs normal.  BMI 28.25.  Walking.  Father with Prostate Cancer.  Health Labs with Dr Birdie Riddle.  Past medical history,surgical history, family history and social history were all reviewed and documented in the EPIC chart.  Gynecologic History No LMP recorded. Patient has had a hysterectomy. Contraception: status post hysterectomy Last Pap: 2019. Results were: normal per patient Last mammogram: 05/2018. Results were: normal Bone Density: Never Colonoscopy: 2014  Obstetric History OB History  Gravida Para Term Preterm AB Living  2 2       2   SAB TAB Ectopic Multiple Live Births               # Outcome Date GA Lbr Len/2nd Weight Sex Delivery Anes PTL Lv  2 Para           1 Para              ROS: A ROS was performed and pertinent positives and negatives are included in the history.  GENERAL: No fevers or chills. HEENT: No change in vision, no earache, sore throat or sinus congestion. NECK: No pain or stiffness. CARDIOVASCULAR: No chest pain or pressure. No palpitations. PULMONARY: No shortness of breath, cough or wheeze. GASTROINTESTINAL: No abdominal pain, nausea, vomiting or diarrhea, melena or bright red blood per rectum. GENITOURINARY: No urinary frequency, urgency, hesitancy or dysuria. MUSCULOSKELETAL: No joint or muscle pain, no back pain, no recent trauma. DERMATOLOGIC: No rash, no itching, no lesions. ENDOCRINE: No polyuria, polydipsia, no heat or cold intolerance. No recent change in weight. HEMATOLOGICAL: No anemia or easy bruising or bleeding. NEUROLOGIC: No headache, seizures, numbness, tingling or weakness. PSYCHIATRIC: No depression, no loss of interest in normal activity or change in  sleep pattern.     Exam:   BP 120/76   Ht 5\' 6"  (1.676 m)   Wt 175 lb (79.4 kg)   BMI 28.25 kg/m   Body mass index is 28.25 kg/m.  General appearance : Well developed well nourished female. No acute distress HEENT: Eyes: no retinal hemorrhage or exudates,  Neck supple, trachea midline, no carotid bruits, no thyroidmegaly Lungs: Clear to auscultation, no rhonchi or wheezes, or rib retractions  Heart: Regular rate and rhythm, no murmurs or gallops Breast:Examined in sitting and supine position were symmetrical in appearance, no palpable masses or tenderness,  no skin retraction, no nipple inversion, no nipple discharge, no skin discoloration, no axillary or supraclavicular lymphadenopathy Abdomen: no palpable masses or tenderness, no rebound or guarding Extremities: no edema or skin discoloration or tenderness  Pelvic: Vulva: Normal             Vagina: No gross lesions or discharge.  Pap reflex done.  Cervix/Uterus Absent  Adnexa  Without masses or tenderness  Anus: Normal  U/A: Clear, yellow, moderate bacteria, WBC 6-10, RBC10-20.  Pending U. Culture   Assessment/Plan:  58 y.o. female for annual exam   1. Encounter for Papanicolaou smear of vagina as part of routine gynecological examination Gynecologic exam status post total hysterectomy in menopause.  Pap test done at the vaginal vault.  Breast exam normal.  Screening mammogram August 2019 was normal, patient will  schedule screening mammogram now.  Colonoscopy 2014.  Health labs with family physician.  2. S/P total hysterectomy  3. H/O hematuria History of hematuria previously investigated by urology.  Persistent of hematuria with red blood cells at 10-20.  Recommend reevaluation by urology. - Urinalysis,Complete w/RFL Culture  4. Overweight (BMI 25.0-29.9) Recommend lower calorie/carb diet such as [employer].  Aerobic physical activities 5 times a week and weightlifting every 2 days.  Princess Bruins MD, 3:38  PM 07/17/2019

## 2019-07-18 ENCOUNTER — Other Ambulatory Visit: Payer: Self-pay | Admitting: Obstetrics & Gynecology

## 2019-07-18 DIAGNOSIS — Z1231 Encounter for screening mammogram for malignant neoplasm of breast: Secondary | ICD-10-CM

## 2019-07-18 LAB — PAP IG W/ RFLX HPV ASCU

## 2019-07-19 LAB — URINALYSIS, COMPLETE W/RFL CULTURE
Bilirubin Urine: NEGATIVE
Glucose, UA: NEGATIVE
Hyaline Cast: NONE SEEN /LPF
Ketones, ur: NEGATIVE
Nitrites, Initial: NEGATIVE
Protein, ur: NEGATIVE
Specific Gravity, Urine: 1.025 (ref 1.001–1.03)
pH: 6.5 (ref 5.0–8.0)

## 2019-07-19 LAB — URINE CULTURE
MICRO NUMBER:: 909897
SPECIMEN QUALITY:: ADEQUATE

## 2019-07-19 LAB — CULTURE INDICATED

## 2019-07-21 ENCOUNTER — Encounter: Payer: Self-pay | Admitting: Obstetrics & Gynecology

## 2019-07-21 NOTE — Patient Instructions (Signed)
1. Encounter for Papanicolaou smear of vagina as part of routine gynecological examination Gynecologic exam status post total hysterectomy in menopause.  Pap test done at the vaginal vault.  Breast exam normal.  Screening mammogram August 2019 was normal, patient will schedule screening mammogram now.  Colonoscopy 2014.  Health labs with family physician.  2. S/P total hysterectomy  3. H/O hematuria History of hematuria previously investigated by urology.  Persistent of hematuria with red blood cells at 10-20.  Recommend reevaluation by urology. - Urinalysis,Complete w/RFL Culture  4. Overweight (BMI 25.0-29.9) Recommend lower calorie/carb diet such as [employer].  Aerobic physical activities 5 times a week and weightlifting every 2 days.  Naomii, it was a pleasure meeting you today!  I will inform you of your results as soon as they are available.

## 2019-08-13 ENCOUNTER — Ambulatory Visit: Payer: BC Managed Care – PPO | Admitting: Obstetrics & Gynecology

## 2019-08-30 ENCOUNTER — Other Ambulatory Visit: Payer: Self-pay

## 2019-08-30 ENCOUNTER — Ambulatory Visit
Admission: RE | Admit: 2019-08-30 | Discharge: 2019-08-30 | Disposition: A | Discharge status: Home / Self Care | Payer: BC Managed Care – PPO | Source: Ambulatory Visit | Attending: Obstetrics & Gynecology | Admitting: Obstetrics & Gynecology

## 2019-08-30 DIAGNOSIS — Z1231 Encounter for screening mammogram for malignant neoplasm of breast: Secondary | ICD-10-CM

## 2019-12-31 ENCOUNTER — Telehealth: Payer: Self-pay

## 2019-12-31 NOTE — Telephone Encounter (Signed)
Patient scheduled a virtual appointment for 01/03/20 with PCP. Patient would rather be seen in office. No covid symptoms. Patient states her thumb is locked up every morning and range of motion is limited. She is also having heel pain. Please advise.

## 2020-01-02 NOTE — Telephone Encounter (Signed)
Pt can be in office if she passes screening

## 2020-01-02 NOTE — Telephone Encounter (Signed)
Pt has been made aware of this.

## 2020-01-03 ENCOUNTER — Ambulatory Visit (INDEPENDENT_AMBULATORY_CARE_PROVIDER_SITE_OTHER): Payer: BC Managed Care – PPO | Admitting: Family Medicine

## 2020-01-03 ENCOUNTER — Encounter: Payer: Self-pay | Admitting: Family Medicine

## 2020-01-03 ENCOUNTER — Other Ambulatory Visit: Payer: Self-pay

## 2020-01-03 VITALS — BP 121/78 | HR 80 | Temp 97.9°F | Resp 16 | Ht 66.0 in | Wt 179.4 lb

## 2020-01-03 DIAGNOSIS — E559 Vitamin D deficiency, unspecified: Secondary | ICD-10-CM

## 2020-01-03 DIAGNOSIS — M79644 Pain in right finger(s): Secondary | ICD-10-CM

## 2020-01-03 NOTE — Patient Instructions (Signed)
Follow up as needed or as scheduled We'll notify you of your lab results and make any changes if needed We'll call you with your appt for the hand specialist In the meantime, start the Meloxicam once daily- take w/ food ICE Take 1 of the multivitamin daily- not 3 Call with any questions or concerns Stay Safe!  Stay Healthy!

## 2020-01-03 NOTE — Progress Notes (Signed)
   Subjective:    Patient ID: Hannah Ayala, female    DOB: Oct 17, 1961, 59 y.o.   MRN: [id number]  HPI Thumb pain- R thumb, dominant hand.  When she wakes, has limited motion that improves as day progresses.  Pt states that thumb is 'locked' in the morning and will have mild triggering/'jerky movements' throughout the day.  Pt reports 'you can feel a little something' at [facility] joint.  Pt does a lot of knitting.  sxs started ~1 week ago.  No known injury.  No weakness or numbness of thumb or hand.  Pt took 1 Meloxicam and when 'it didn't work' she didn't take another  Vit D deficiency- pt admits to not taking the prescription as directed but is 'trying to do better'.  Wants to know if she needs a refill.   Review of Systems For ROS see HPI   This visit occurred during the SARS-CoV-2 public health emergency.  Safety protocols were in place, including screening questions prior to the visit, additional usage of staff PPE, and extensive cleaning of exam room while observing appropriate contact time as indicated for disinfecting solutions.       Objective:   Physical Exam Vitals reviewed.  Constitutional:      General: She is not in acute distress.    Appearance: Normal appearance. She is not ill-appearing.  HENT:     Head: Normocephalic and atraumatic.  Cardiovascular:     Pulses: Normal pulses.  Musculoskeletal:        General: Deformity (pt w/ nodular area of R thumb MCP joint in webbing betwen thumb and 1st finger, mildly TTP) present. No swelling.     Comments: Mild triggering of R thumb  Skin:    General: Skin is warm and dry.     Findings: No bruising or rash.  Neurological:     General: No focal deficit present.     Mental Status: She is alert and oriented to person, place, and time.     Cranial Nerves: No cranial nerve deficit.     Sensory: No sensory deficit.     Motor: No weakness.     Coordination: Coordination normal.           Assessment & Plan:  R  thumb pain- new.  Pt is able to use her thumb normally as the day goes on but has pain and limited ROM upon waking.  She seems to have a nodular area at MCP joint that is mildly TTP.  Will restart daily Meloxicam (she has this at home from previous visit).  Ice as needed for pain relief.  Will refer to hand specialist for complete evaluation and tx.  Vit D Deficiency- pt admits to not taking Vit D as directed.  Will check labs and determine if additional tx is needed.

## 2020-01-04 LAB — VITAMIN D 25 HYDROXY (VIT D DEFICIENCY, FRACTURES): VITD: 25.58 ng/mL — ABNORMAL LOW (ref 30.00–100.00)

## 2020-01-18 ENCOUNTER — Ambulatory Visit: Payer: BC Managed Care – PPO | Admitting: Family Medicine

## 2020-01-18 ENCOUNTER — Other Ambulatory Visit: Payer: Self-pay

## 2020-01-18 ENCOUNTER — Encounter: Payer: Self-pay | Admitting: Family Medicine

## 2020-01-18 ENCOUNTER — Ambulatory Visit: Payer: Self-pay

## 2020-01-18 DIAGNOSIS — M79644 Pain in right finger(s): Secondary | ICD-10-CM

## 2020-01-18 MED ORDER — DICLOFENAC SODIUM 1 % EX GEL: 4.0000 g | Freq: Four times a day (QID) | CUTANEOUS | 6 refills | Status: DC | PRN

## 2020-01-18 NOTE — Progress Notes (Signed)
Office Visit Note   Patient: Hannah Ayala           Date of Birth: Sep 17, 1961           MRN: [id number] Visit Date: 01/18/2020 Requested by: Midge Minium, MD [address] [place],  [place] [address] PCP: Midge Minium, MD  Subjective: Chief Complaint  Patient presents with  . Right Thumb - Pain    HPI: She is here with right thumb pain.  About 2 weeks ago she started noticing tenderness and a nodule on the palmar aspect of her thumb near the MCP joint.  No change in her activities to account for this.  Her thumb seems to be locked sometimes in the morning when she wakes up.  Denies any numbness.  She is not taking anything for pain.  No previous problems with her phone.  She is a Insurance account manager.              ROS:   All other systems were reviewed and are negative.  Objective: Vital Signs: There were no vitals taken for this visit.  Physical Exam:  General:  Alert and oriented, in no acute distress. Pulm:  Breathing unlabored. Psy:  Normal mood, congruent affect. Skin: No erythema or warmth. Right hand: She has a tender nodule near the A1 pulley.  She has normal flexion and extension of the thumb IP joint passively and actively.  No triggering today.   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  No other bony abnormality.  Limited diagnostic ultrasound right thumb: She has swelling of the A1 pulley and a ganglion cyst arising from it.  Flexor tendon looks normal.   Assessment & Plan: 1.  Right thumb pain due to trigger thumb with ganglion cyst arising from A1 pulley -Discussed treatment options and elected to try ice applications combined with Voltaren gel.  If symptoms persist we will try occupational therapy.  If still no relief, then cortisone injection.     Procedures: No procedures performed  No notes on file     PMFS History: Patient Active Problem List   Diagnosis Date Noted  . Overweight (BMI 25.0-29.9) 05/02/2019   . Physical exam 04/21/2018  . Vitamin D deficiency 04/21/2018   Past Medical History:  Diagnosis Date  . Benign colonic polyp   . History of chickenpox     Family History  Problem Relation Age of Onset  . Asthma Mother   . Hyperlipidemia Mother   . Cancer Father        Prostate  . Diabetes Father   . Hearing loss Father   . Hyperlipidemia Father   . Hypertension Father   . Asthma Maternal Grandmother   . Diabetes Maternal Grandmother   . Heart disease Maternal Grandfather   . Heart attack Maternal Grandfather   . Early death Paternal Grandmother   . Cancer Paternal Grandfather     Past Surgical History:  Procedure Laterality Date  . ABDOMINAL HYSTERECTOMY    . HERNIA REPAIR     umbilical   Social History   Occupational History  . Not on file  Tobacco Use  . Smoking status: Never Smoker  . Smokeless tobacco: Never Used  Substance and Sexual Activity  . Alcohol use: No  . Drug use: No  . Sexual activity: Not Currently    Comment: 1st intercourse- 20, partners- ?

## 2020-02-01 ENCOUNTER — Telehealth: Payer: Self-pay | Admitting: Family Medicine

## 2020-02-01 NOTE — Telephone Encounter (Signed)
She is not currently on any medication that would interfere with this but she should be aware that people tolerate these regimens very differently.  If she doesn't feel well or has side effects, she should stop immediately

## 2020-02-01 NOTE — Telephone Encounter (Signed)
[place] for pt to begin OTC meds.

## 2020-02-01 NOTE — Telephone Encounter (Signed)
Pt is wanting to start a Green Smoothie Cleanse with includes a liver cleanse - Liver Rescue by [employer] and Livatone Plus by Dr Lelon Mast. Mag07 is another med that comes with it.  This product advises people to consult with their PCP before starting it.   She would like a call back advising if this is ok or if you have another recommendation.

## 2020-02-01 NOTE — Telephone Encounter (Signed)
Patient notified of PCP recommendations and is agreement and expresses an understanding.  

## 2020-02-06 ENCOUNTER — Other Ambulatory Visit: Payer: Self-pay

## 2020-02-07 ENCOUNTER — Encounter: Payer: Self-pay | Admitting: Family Medicine

## 2020-02-07 ENCOUNTER — Ambulatory Visit: Payer: BC Managed Care – PPO | Admitting: Family Medicine

## 2020-02-07 VITALS — BP 120/76 | HR 72 | Temp 97.2°F | Resp 16 | Ht 66.0 in | Wt 176.5 lb

## 2020-02-07 DIAGNOSIS — S39011A Strain of muscle, fascia and tendon of abdomen, initial encounter: Secondary | ICD-10-CM

## 2020-02-07 NOTE — Progress Notes (Signed)
   Subjective:    Patient ID: Hannah Ayala, female    DOB: August 28, 1961, 60 y.o.   MRN: [id number]  HPI Pain- pt reports sxs started after bending forward in [employer] 6 days ago.  Felt instant discomfort.  Initially thought pain was in her back but no improvement w/ heat or muscle relaxer.  Then realized pain was in RLQ, near ASIS.  Pain has continually improved over the week.  Initially had pain w/ certain movements- including coughing, laughing.  After initial onset of pain, she drove to [facility] and felt very stiff upon standing and moving.  Denies urinary sxs- dysuria, urgency, frequency.  No N/V.   Review of Systems For ROS see HPI   This visit occurred during the SARS-CoV-2 public health emergency.  Safety protocols were in place, including screening questions prior to the visit, additional usage of staff PPE, and extensive cleaning of exam room while observing appropriate contact time as indicated for disinfecting solutions.       Objective:   Physical Exam Vitals reviewed.  Constitutional:      General: She is not in acute distress.    Appearance: She is well-developed. She is not ill-appearing.  HENT:     Head: Normocephalic and atraumatic.  Abdominal:     General: Abdomen is flat. There is no distension.     Palpations: Abdomen is soft. There is no fluid wave, hepatomegaly or mass.     Tenderness: There is no abdominal tenderness.     Hernia: No hernia is present.     Comments: No TTP but pt does have mild discomfort when sitting up straight and engaging her core  Skin:    General: Skin is warm and dry.  Neurological:     General: No focal deficit present.     Mental Status: She is alert.           Assessment & Plan:  Abd strain- new.  Pt's sxs and current PE consistent w/ dx.  Since sxs have almost completely resolved, no need for additional work up or treatment.  Pt expressed understanding and is in agreement w/ plan.

## 2020-02-07 NOTE — Patient Instructions (Signed)
Follow up as needed or as scheduled I'm glad that things are improving Make sure you bend at the knees Continue to work on core strengthening- tighten those abs while sitting Call with any questions or concerns Happy [place]!!!

## 2020-05-06 IMAGING — MG DIGITAL SCREENING BILAT W/ TOMO W/ CAD
6 of 10 series · 6 of 30 positions shown · non-contrast
Comparison: Previous exam(s).

CLINICAL DATA: Screening.

EXAM:
DIGITAL SCREENING BILATERAL MAMMOGRAM WITH TOMO AND CAD

[L MLO synth-2D]
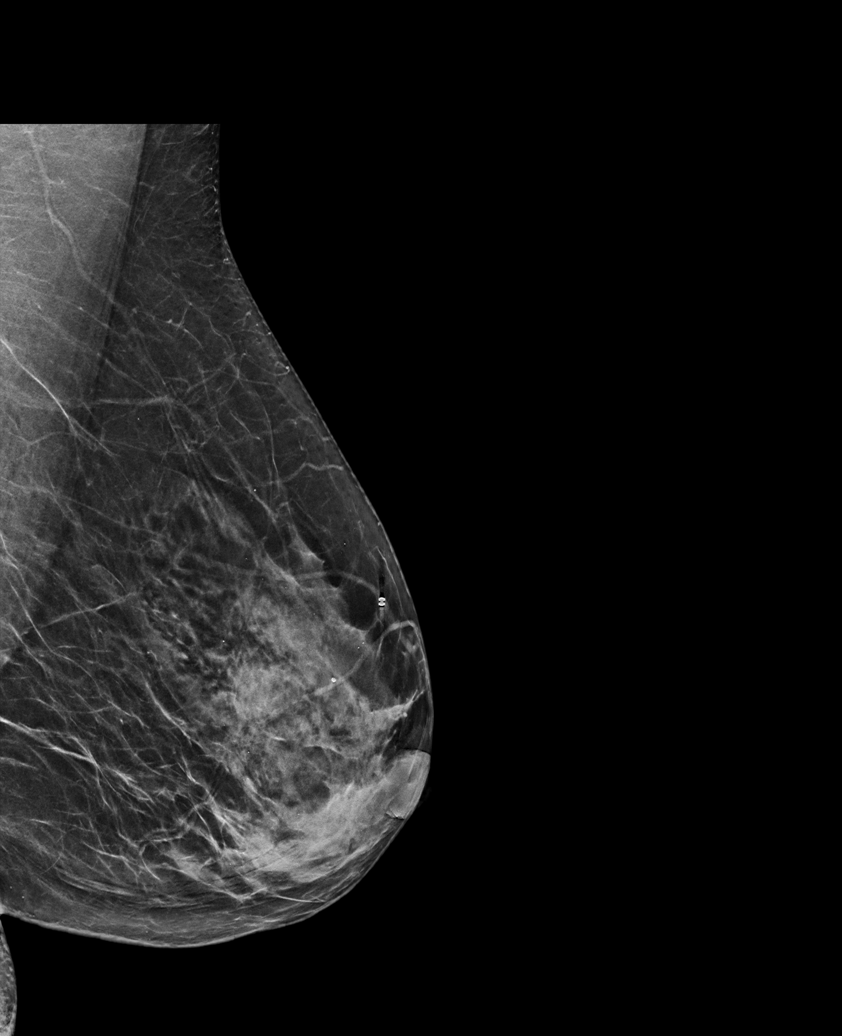

[R CC synth-2D (1 of 2)]
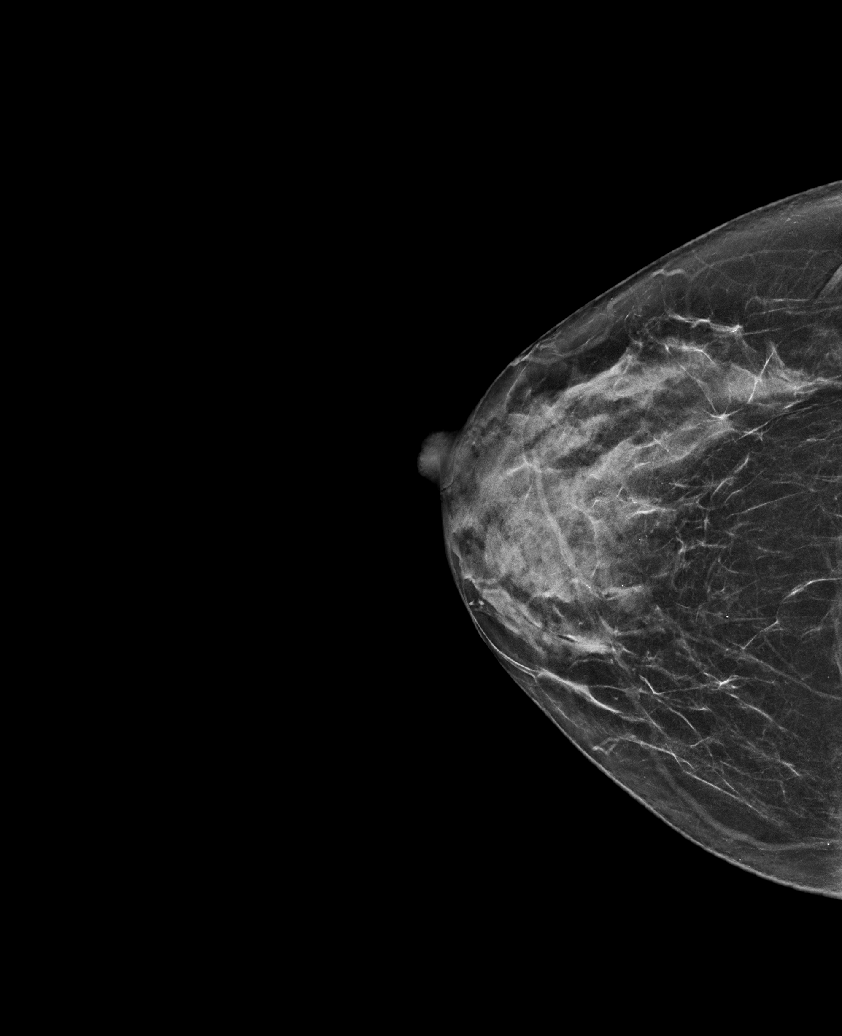

[R CC synth-2D (2 of 2)]
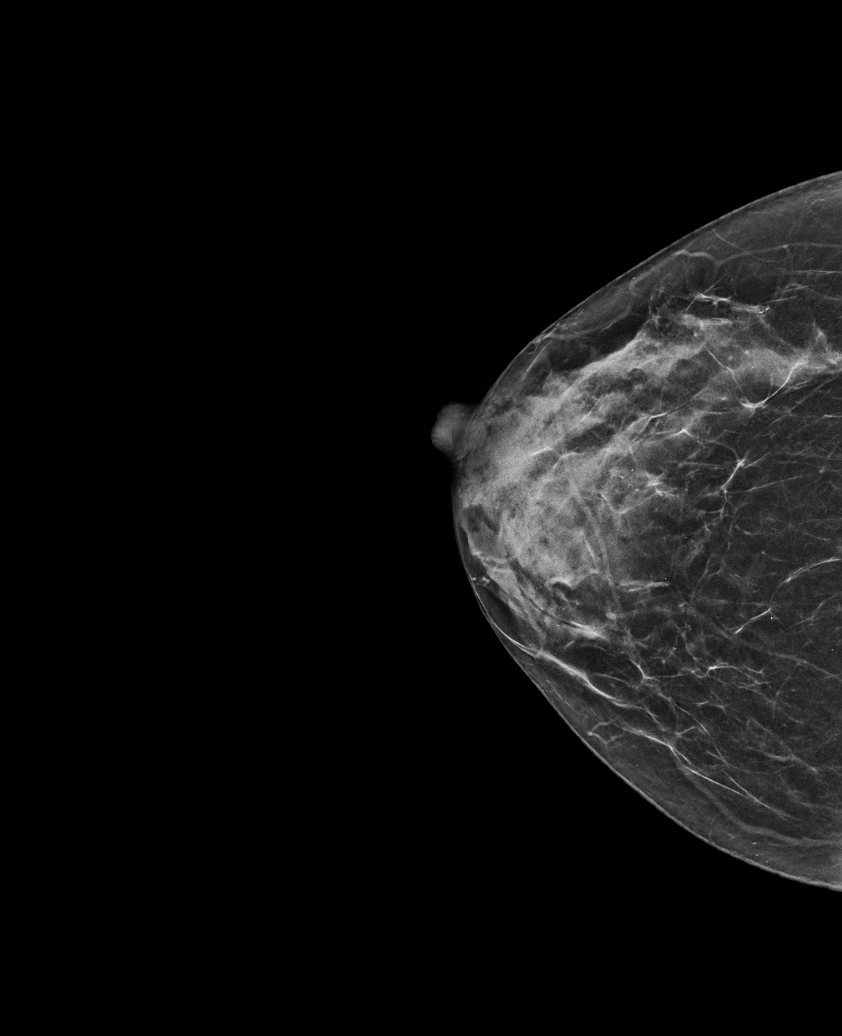

[R MLO synth-2D]
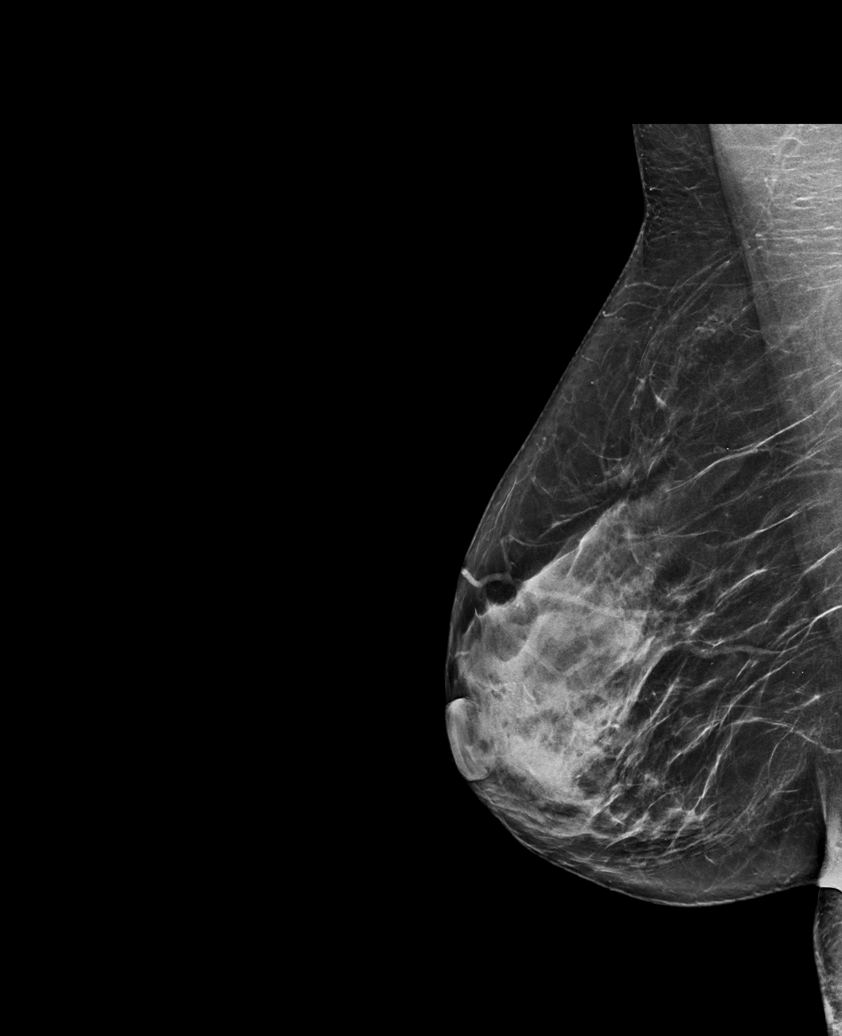

[L CC synth-2D]
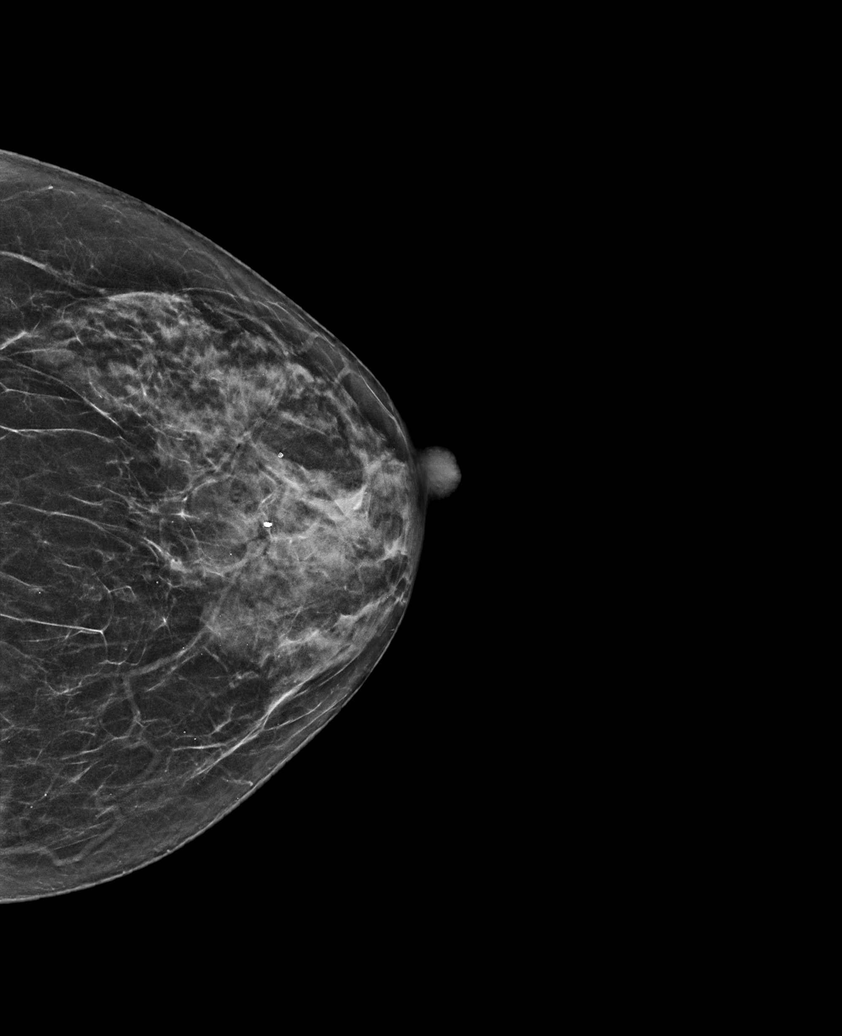

[L CC tomo · tomo slice 29/56.0]
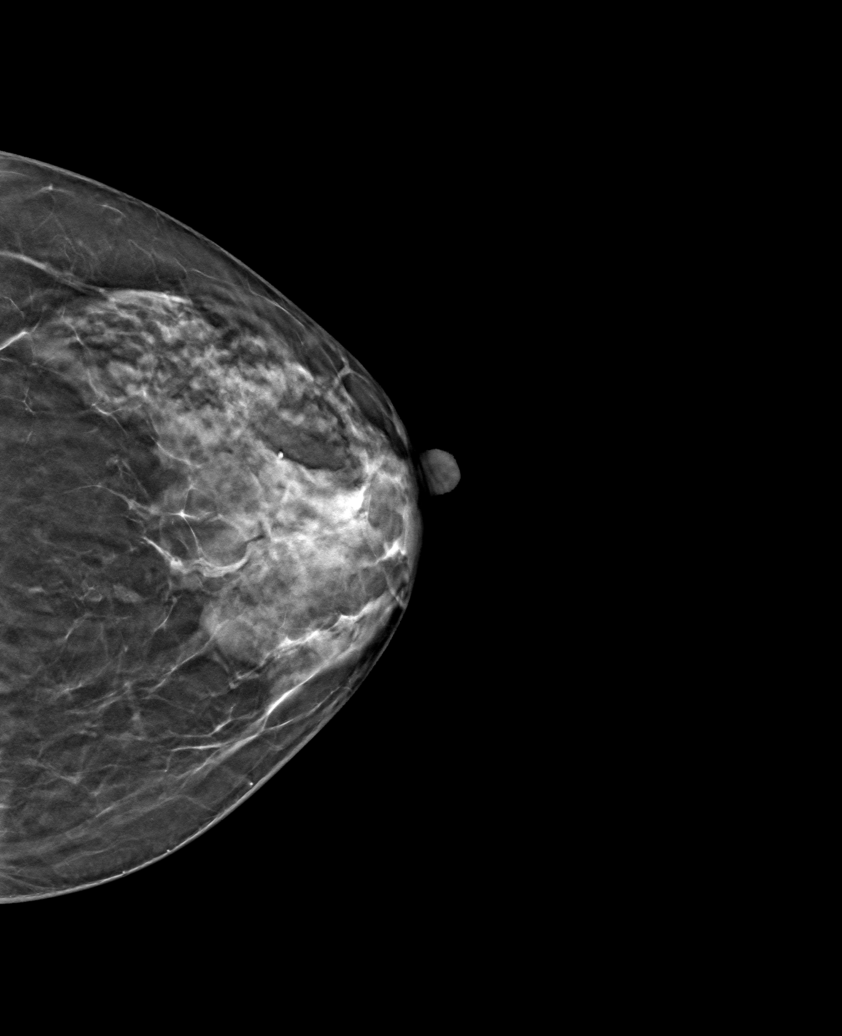

[6 of 30 positions shown; findings below may reference images not displayed]

ACR Breast Density Category c: The breast tissue is heterogeneously
dense, which may obscure small masses.
FINDINGS: There are no findings suspicious for malignancy. Images were
processed with CAD.
IMPRESSION: No mammographic evidence of malignancy. A result letter of this
screening mammogram will be mailed directly to the patient.

RECOMMENDATION:
Screening mammogram in one year. (Code:[id number]-U-LHB)

BI-RADS CATEGORY  1: Negative.

## 2020-05-07 ENCOUNTER — Ambulatory Visit (INDEPENDENT_AMBULATORY_CARE_PROVIDER_SITE_OTHER): Payer: BC Managed Care – PPO | Admitting: Family Medicine

## 2020-05-07 ENCOUNTER — Encounter: Payer: Self-pay | Admitting: Family Medicine

## 2020-05-07 ENCOUNTER — Other Ambulatory Visit: Payer: Self-pay

## 2020-05-07 VITALS — BP 122/80 | HR 76 | Temp 98.0°F | Resp 16 | Ht 66.0 in | Wt 182.4 lb

## 2020-05-07 DIAGNOSIS — E559 Vitamin D deficiency, unspecified: Secondary | ICD-10-CM

## 2020-05-07 DIAGNOSIS — Z0000 Encounter for general adult medical examination without abnormal findings: Secondary | ICD-10-CM

## 2020-05-07 DIAGNOSIS — R499 Unspecified voice and resonance disorder: Secondary | ICD-10-CM

## 2020-05-07 DIAGNOSIS — E663 Overweight: Secondary | ICD-10-CM | POA: Diagnosis not present

## 2020-05-07 DIAGNOSIS — R82998 Other abnormal findings in urine: Secondary | ICD-10-CM

## 2020-05-07 DIAGNOSIS — Z87448 Personal history of other diseases of urinary system: Secondary | ICD-10-CM | POA: Insufficient documentation

## 2020-05-07 LAB — POCT URINALYSIS DIPSTICK
Bilirubin, UA: NEGATIVE
Glucose, UA: NEGATIVE
Ketones, UA: NEGATIVE
Nitrite, UA: NEGATIVE
Protein, UA: NEGATIVE
Spec Grav, UA: >=1.03 — AB (ref 1.010–1.025)
Urobilinogen, UA: 0.2 E.U./dL
pH, UA: 6 (ref 5.0–8.0)

## 2020-05-07 NOTE — Assessment & Plan Note (Signed)
Check labs and replete prn. 

## 2020-05-07 NOTE — Assessment & Plan Note (Signed)
Pt has hx of microscopic hematuria.  Check UA to monitor.

## 2020-05-07 NOTE — Assessment & Plan Note (Signed)
Discussed need for healthy diet and regular exercise.  Check labs to risk stratify.

## 2020-05-07 NOTE — Patient Instructions (Signed)
Follow up in 1 year or as needed We'll notify you of your lab results and make any changes if needed Continue to work on healthy diet and regular exercise- you can do it! We'll call you with your ENT appt for the hoarseness Call with any questions or concerns Have a great summer!!!

## 2020-05-07 NOTE — Progress Notes (Signed)
   Subjective:    Patient ID: Hannah Ayala, female    DOB: January 18, 1961, 59 y.o.   MRN: [id number]  HPI CPE- UTD on pap, mammo, colonoscopy, immunizations.   Review of Systems Patient reports no vision/ hearing changes, adenopathy,fever, weight change, swallowing issues, chest pain, palpitations, edema, persistant/recurrent cough, hemoptysis, dyspnea (rest/exertional/paroxysmal nocturnal), gastrointestinal bleeding (melena, rectal bleeding), abdominal pain, significant heartburn, bowel changes, GU symptoms (dysuria, hematuria, incontinence), Gyn symptoms (abnormal  bleeding, pain),  syncope, focal weakness, memory loss, numbness & tingling, skin/hair/nail changes, abnormal bruising or bleeding, anxiety, or depression.   + hoarseness- pt reports she is no longer able to sing, particularly in the higher register.  Pt reports ongoing raspy-ness.      Objective:   Physical Exam General Appearance:    Alert, cooperative, no distress, appears stated age  Head:    Normocephalic, without obvious abnormality, atraumatic  Eyes:    PERRL, conjunctiva/corneas clear, EOM's intact, fundi    benign, both eyes  Ears:    Normal TM's and external ear canals, both ears  Nose:   Deferred due to COVID  Throat:   Neck:   Supple, symmetrical, trachea midline, no adenopathy;    Thyroid: no enlargement/tenderness/nodules  Back:     Symmetric, no curvature, ROM normal, no CVA tenderness  Lungs:     Clear to auscultation bilaterally, respirations unlabored  Chest Wall:    No tenderness or deformity   Heart:    Regular rate and rhythm, S1 and S2 normal, no murmur, rub   or gallop  Breast Exam:    Deferred to GYN  Abdomen:     Soft, non-tender, bowel sounds active all four quadrants,    no masses, no organomegaly  Genitalia:    Deferred to GYN  Rectal:    Extremities:   Extremities normal, atraumatic, no cyanosis or edema  Pulses:   2+ and symmetric all extremities  Skin:   Skin color, texture,  turgor normal, no rashes or lesions  Lymph nodes:   Cervical, supraclavicular, and axillary nodes normal  Neurologic:   CNII-XII intact, normal strength, sensation and reflexes    throughout          Assessment & Plan:

## 2020-05-07 NOTE — Assessment & Plan Note (Signed)
Pt's PE WNL w/ exception of being overweight.  UTD on pap, mammo, colonoscopy.  Check labs.  Anticipatory guidance provided.

## 2020-05-08 ENCOUNTER — Other Ambulatory Visit: Payer: Self-pay | Admitting: General Practice

## 2020-05-08 LAB — CBC WITH DIFFERENTIAL/PLATELET
Basophils Absolute: 0.1 10*3/uL (ref 0.0–0.1)
Basophils Relative: 1 % (ref 0.0–3.0)
Eosinophils Absolute: 0.2 10*3/uL (ref 0.0–0.7)
Eosinophils Relative: 3.3 % (ref 0.0–5.0)
HCT: 38 % (ref 36.0–46.0)
Hemoglobin: 12.7 g/dL (ref 12.0–15.0)
Lymphocytes Relative: 26.5 % (ref 12.0–46.0)
Lymphs Abs: 1.5 10*3/uL (ref 0.7–4.0)
MCHC: 33.5 g/dL (ref 30.0–36.0)
MCV: 81.7 fl (ref 78.0–100.0)
Monocytes Absolute: 0.5 10*3/uL (ref 0.1–1.0)
Monocytes Relative: 8.9 % (ref 3.0–12.0)
Neutro Abs: 3.4 10*3/uL (ref 1.4–7.7)
Neutrophils Relative %: 60.3 % (ref 43.0–77.0)
Platelets: 295 10*3/uL (ref 150.0–400.0)
RBC: 4.65 Mil/uL (ref 3.87–5.11)
RDW: 14.8 % (ref 11.5–15.5)
WBC: 5.7 10*3/uL (ref 4.0–10.5)

## 2020-05-08 LAB — BASIC METABOLIC PANEL
BUN: 12 mg/dL (ref 6–23)
CO2: 26 mEq/L (ref 19–32)
Calcium: 9.3 mg/dL (ref 8.4–10.5)
Chloride: 106 mEq/L (ref 96–112)
Creatinine, Ser: 0.85 mg/dL (ref 0.40–1.20)
GFR: 68.84 mL/min (ref >60.00)
Glucose, Bld: 84 mg/dL (ref 70–99)
Potassium: 4.3 mEq/L (ref 3.5–5.1)
Sodium: 140 mEq/L (ref 135–145)

## 2020-05-08 LAB — URINE CULTURE
MICRO NUMBER:: 10704428
SPECIMEN QUALITY:: ADEQUATE

## 2020-05-08 LAB — HEPATIC FUNCTION PANEL
ALT: 9 U/L (ref 0–35)
AST: 11 U/L (ref 0–37)
Albumin: 4.4 g/dL (ref 3.5–5.2)
Alkaline Phosphatase: 108 U/L (ref 39–117)
Bilirubin, Direct: 0.1 mg/dL (ref 0.0–0.3)
Total Bilirubin: 0.6 mg/dL (ref 0.2–1.2)
Total Protein: 6.6 g/dL (ref 6.0–8.3)

## 2020-05-08 LAB — LIPID PANEL
Cholesterol: 162 mg/dL (ref 0–200)
HDL: 41.5 mg/dL (ref >39.00)
LDL Cholesterol: 102 mg/dL — ABNORMAL HIGH (ref 0–99)
NonHDL: 120.31
Total CHOL/HDL Ratio: 4
Triglycerides: 94 mg/dL (ref 0.0–149.0)
VLDL: 18.8 mg/dL (ref 0.0–40.0)

## 2020-05-08 LAB — TSH: TSH: 0.44 u[IU]/mL (ref 0.35–4.50)

## 2020-05-08 LAB — VITAMIN D 25 HYDROXY (VIT D DEFICIENCY, FRACTURES): VITD: 20.17 ng/mL — ABNORMAL LOW (ref 30.00–100.00)

## 2020-05-08 MED ORDER — VITAMIN D (ERGOCALCIFEROL) 1.25 MG (50000 UNIT) PO CAPS
50000.0000 [IU] | ORAL_CAPSULE | ORAL | 0 refills | Status: DC
Start: 2020-05-08 — End: 2021-07-23

## 2020-05-09 ENCOUNTER — Other Ambulatory Visit: Payer: Self-pay | Admitting: General Practice

## 2020-05-09 DIAGNOSIS — Z87448 Personal history of other diseases of urinary system: Secondary | ICD-10-CM

## 2020-07-17 ENCOUNTER — Encounter: Payer: BC Managed Care – PPO | Admitting: Obstetrics & Gynecology

## 2020-07-22 ENCOUNTER — Ambulatory Visit (INDEPENDENT_AMBULATORY_CARE_PROVIDER_SITE_OTHER): Payer: BC Managed Care – PPO | Admitting: Obstetrics & Gynecology

## 2020-07-22 ENCOUNTER — Encounter: Payer: Self-pay | Admitting: Obstetrics & Gynecology

## 2020-07-22 ENCOUNTER — Other Ambulatory Visit: Payer: Self-pay

## 2020-07-22 VITALS — BP 124/80 | Ht 66.0 in | Wt 183.0 lb

## 2020-07-22 DIAGNOSIS — Z01419 Encounter for gynecological examination (general) (routine) without abnormal findings: Secondary | ICD-10-CM | POA: Diagnosis not present

## 2020-07-22 DIAGNOSIS — Z90710 Acquired absence of both cervix and uterus: Secondary | ICD-10-CM

## 2020-07-22 DIAGNOSIS — E663 Overweight: Secondary | ICD-10-CM

## 2020-07-22 NOTE — Progress Notes (Signed)
    Hannah Ayala 1961-08-21 [id number]   History:    59 y.o. G2P2L2 Divorced x many yrs.    RP:  Established patient presenting for annual gyn exam   HPI: S/P Total Hysterectomy for Fibroids.  No pelvic pain.  No menopausal Sx.  Abstinent x many yrs.  Breasts normal.  Urine normal, h/o hematuria.  BMs normal.  BMI 29.54.  Walking.  Father with Prostate Cancer.  Health Labs with Dr Birdie Riddle.  Past medical history,surgical history, family history and social history were all reviewed and documented in the EPIC chart.  Gynecologic History No LMP recorded. Patient has had a hysterectomy.  Obstetric History OB History  Gravida Para Term Preterm AB Living  2 2       2   SAB TAB Ectopic Multiple Live Births               # Outcome Date GA Lbr Len/2nd Weight Sex Delivery Anes PTL Lv  2 Para           1 Para              ROS: A ROS was performed and pertinent positives and negatives are included in the history.  GENERAL: No fevers or chills. HEENT: No change in vision, no earache, sore throat or sinus congestion. NECK: No pain or stiffness. CARDIOVASCULAR: No chest pain or pressure. No palpitations. PULMONARY: No shortness of breath, cough or wheeze. GASTROINTESTINAL: No abdominal pain, nausea, vomiting or diarrhea, melena or bright red blood per rectum. GENITOURINARY: No urinary frequency, urgency, hesitancy or dysuria. MUSCULOSKELETAL: No joint or muscle pain, no back pain, no recent trauma. DERMATOLOGIC: No rash, no itching, no lesions. ENDOCRINE: No polyuria, polydipsia, no heat or cold intolerance. No recent change in weight. HEMATOLOGICAL: No anemia or easy bruising or bleeding. NEUROLOGIC: No headache, seizures, numbness, tingling or weakness. PSYCHIATRIC: No depression, no loss of interest in normal activity or change in sleep pattern.     Exam:   Ht 5\' 6"  (1.676 m)   Wt 183 lb (83 kg)   BMI 29.54 kg/m   Body mass index is 29.54 kg/m.  General appearance : Well  developed well nourished female. No acute distress HEENT: Eyes: no retinal hemorrhage or exudates,  Neck supple, trachea midline, no carotid bruits, no thyroidmegaly Lungs: Clear to auscultation, no rhonchi or wheezes, or rib retractions  Heart: Regular rate and rhythm, no murmurs or gallops Breast:Examined in sitting and supine position were symmetrical in appearance, no palpable masses or tenderness,  no skin retraction, no nipple inversion, no nipple discharge, no skin discoloration, no axillary or supraclavicular lymphadenopathy Abdomen: no palpable masses or tenderness, no rebound or guarding Extremities: no edema or skin discoloration or tenderness  Pelvic: Vulva: Normal             Vagina: No gross lesions or discharge  Cervix/Uterus absent  Adnexa  Without masses or tenderness  Anus: Normal   Assessment/Plan:  59 y.o. female for annual exam   1. Well female exam with routine gynecological exam Gynecologic exam status post total hysterectomy.  No indication for Pap test this year.  Breast exam normal.  Screening mammogram November 2020.  Refer to [facility] for repeat Colonoscopy.  Health labs with family physician.  2. S/P total hysterectomy  3. Overweight (BMI 25.0-29.9) Recommend a lower calorie/carb diet.  Aerobic activities 5 times a week and light weightlifting every 2 days.  Princess Bruins MD, 12:03 PM 07/22/2020

## 2020-07-23 ENCOUNTER — Other Ambulatory Visit: Payer: Self-pay | Admitting: Obstetrics & Gynecology

## 2020-07-23 DIAGNOSIS — Z1231 Encounter for screening mammogram for malignant neoplasm of breast: Secondary | ICD-10-CM

## 2020-07-24 ENCOUNTER — Encounter: Payer: Self-pay | Admitting: Obstetrics & Gynecology

## 2020-07-29 ENCOUNTER — Telehealth: Payer: Self-pay | Admitting: Gastroenterology

## 2020-07-29 ENCOUNTER — Telehealth: Payer: Self-pay | Admitting: *Deleted

## 2020-07-29 NOTE — Telephone Encounter (Signed)
I called patient and told her no referral is needed was informed she can call to schedule with [facility]. Patient was driving at the time, so she asked me to call back and leave a detailed message on cell. I called patient back however her voicemail was full.

## 2020-07-29 NOTE — Telephone Encounter (Signed)
-----   Message from Princess Bruins, MD sent at 07/22/2020 12:29 PM EDT ----- Regarding: Refer to [facility] for Colonoscopyt Last Colono 2014 Benign Polyp.  Patient moved to [place], needs a [facility] here for a repeat Colonoscopy now.

## 2020-07-29 NOTE — Telephone Encounter (Signed)
Number given to patient to call and schedule screen colonoscopy with [facility].

## 2020-07-29 NOTE — Telephone Encounter (Signed)
Pt is being referred by Dr Melodie [facility] for a colonoscopy due to a polyp being found on her previous colonoscopy. Pt had one done in [place] around 2014. Records are in epic for review. Please advise on scheduling.

## 2020-07-30 NOTE — Telephone Encounter (Signed)
I can see that there was a colonoscopy completed in 2014 per patient report, and polyp(s) removed, but no actual report or histology for review. Can we obtain a copy of the previous report? If unable to obtain, per current societal guidelines, would recommend repeat colonoscopy now (7+ years) for ongoing polyp surveillance in the absence of knowing size, number, location, histology of those previous polyps from 2014.

## 2020-08-07 ENCOUNTER — Encounter: Payer: Self-pay | Admitting: Gastroenterology

## 2020-09-02 ENCOUNTER — Ambulatory Visit: Payer: BC Managed Care – PPO

## 2020-09-23 ENCOUNTER — Other Ambulatory Visit: Payer: Self-pay

## 2020-09-23 ENCOUNTER — Ambulatory Visit (AMBULATORY_SURGERY_CENTER): Payer: Self-pay

## 2020-09-23 VITALS — Ht 67.5 in | Wt 183.0 lb

## 2020-09-23 DIAGNOSIS — Z86010 Personal history of colonic polyps: Secondary | ICD-10-CM

## 2020-09-23 MED ORDER — PLENVU 140 G PO SOLR: 1.0000 | ORAL | 0 refills | Status: DC

## 2020-09-23 NOTE — Progress Notes (Signed)
No egg or soy allergy known to patient  No issues with past sedation with any surgeries or procedures No intubation problems in the past  No FH of Malignant Hyperthermia No diet pills per patient No home 02 use per patient  No blood thinners per patient  Pt denies issues with constipation  No A fib or A flutter  COVID 19 guidelines implemented in [facility] today with Pt and RN  Coupon given to pt in [facility] today , Code to Pharmacy  COVID vaccines completed on 12/2019 per pt;  Due to the COVID-19 pandemic we are asking patients to follow these guidelines. Please only bring one care partner. Please be aware that your care partner may wait in the car in the parking lot or if they feel like they will be too hot to wait in the car, they may wait in the lobby on the 4th floor. All care partners are required to wear a mask the entire time (we do not have any that we can provide them), they need to practice social distancing, and we will do a Covid check for all patient's and care partners when you arrive. Also we will check their temperature and your temperature. If the care partner waits in their car they need to stay in the parking lot the entire time and we will call them on their cell phone when the patient is ready for discharge so they can bring the car to the front of the building. Also all patient's will need to wear a mask into building.

## 2020-09-24 ENCOUNTER — Encounter: Payer: Self-pay | Admitting: Gastroenterology

## 2020-10-02 ENCOUNTER — Telehealth: Payer: Self-pay | Admitting: Gastroenterology

## 2020-10-02 NOTE — Telephone Encounter (Signed)
Pt states her insurance told her that Golytely is $5 and  she wants to change to this- explained to her that Golytely is 128 oz of prep to drink , so much more volume- pt has already purchased the Plenvu- after the discussion about preps, pt choose to keep the Plenvu prep

## 2020-10-02 NOTE — Telephone Encounter (Signed)
Pt states that copay for Plenvu is $50. Her insurance has better coverage for PEG 3350. She wants to know if she can have that one instead.

## 2020-10-07 ENCOUNTER — Encounter: Payer: Self-pay | Admitting: Gastroenterology

## 2020-10-07 ENCOUNTER — Other Ambulatory Visit: Payer: Self-pay

## 2020-10-07 ENCOUNTER — Ambulatory Visit (AMBULATORY_SURGERY_CENTER): Payer: BC Managed Care – PPO | Admitting: Gastroenterology

## 2020-10-07 VITALS — BP 164/73 | HR 85 | Temp 96.6°F | Resp 23 | Ht 66.0 in | Wt 183.0 lb

## 2020-10-07 DIAGNOSIS — Z86010 Personal history of colonic polyps: Secondary | ICD-10-CM

## 2020-10-07 DIAGNOSIS — K6389 Other specified diseases of intestine: Secondary | ICD-10-CM

## 2020-10-07 DIAGNOSIS — K621 Rectal polyp: Secondary | ICD-10-CM

## 2020-10-07 DIAGNOSIS — K635 Polyp of colon: Secondary | ICD-10-CM

## 2020-10-07 DIAGNOSIS — D124 Benign neoplasm of descending colon: Secondary | ICD-10-CM

## 2020-10-07 DIAGNOSIS — D129 Benign neoplasm of anus and anal canal: Secondary | ICD-10-CM

## 2020-10-07 DIAGNOSIS — K641 Second degree hemorrhoids: Secondary | ICD-10-CM

## 2020-10-07 DIAGNOSIS — D125 Benign neoplasm of sigmoid colon: Secondary | ICD-10-CM

## 2020-10-07 MED ORDER — SODIUM CHLORIDE 0.9 % IV SOLN: 500.0000 mL | Freq: Once | INTRAVENOUS | Status: DC

## 2020-10-07 NOTE — Op Note (Signed)
[facility] Patient Name: Hannah Ayala Procedure Date: 10/07/2020 11:56 AM MRN: [id number] Endoscopist: Gerrit Heck , MD Age: 59 Referring MD:  Date of Birth: [birth year] Gender: Female Account #: [id number] Procedure:                Colonoscopy Indications:              Surveillance: Personal history of colonic polyps                            (unknown histology) on last colonoscopy more than 5                            years ago                           Last colonsocopy was at outside faciltiy in 2014                            and notable for polyps (unsure of size, location,                            number, histology). Medicines:                Monitored Anesthesia Care Procedure:                Pre-Anesthesia Assessment:                           - Prior to the procedure, a History and Physical                            was performed, and patient medications and                            allergies were reviewed. The patient's tolerance of                            previous anesthesia was also reviewed. The risks                            and benefits of the procedure and the sedation                            options and risks were discussed with the patient.                            All questions were answered, and informed consent                            was obtained. Prior Anticoagulants: The patient has                            taken no previous anticoagulant or antiplatelet  agents. ASA Grade Assessment: II - A patient with                            mild systemic disease. After reviewing the risks                            and benefits, the patient was deemed in                            satisfactory condition to undergo the procedure.                           After obtaining informed consent, the colonoscope                            was passed under direct vision. Throughout the                             procedure, the patient's blood pressure, pulse, and                            oxygen saturations were monitored continuously. The                            Colonoscope was introduced through the anus and                            advanced to the the cecum, identified by                            appendiceal orifice and ileocecal valve. The                            colonoscopy was performed without difficulty. The                            patient tolerated the procedure well. The quality                            of the bowel preparation was good. The ileocecal                            valve, appendiceal orifice, and rectum were                            photographed. Scope In: 12:08:11 PM Scope Out: 12:27:06 PM Scope Withdrawal Time: 0 hours 13 minutes 38 seconds  Total Procedure Duration: 0 hours 18 minutes 55 seconds  Findings:                 Skin tags were found on perianal exam.                           Two sessile polyps were found in the sigmoid colon  and descending colon. The polyps were 3 to 5 mm in                            size. These polyps were removed with a cold snare.                            Resection and retrieval were complete. Estimated                            blood loss was minimal.                           Three sessile polyps were found in the rectum and                            recto-sigmoid colon. The polyps were 2 to 3 mm in                            size. These polyps were removed with a cold snare.                            Resection and retrieval were complete. Estimated                            blood loss was minimal.                           Non-bleeding internal hemorrhoids were found during                            retroflexion. The hemorrhoids were small and Grade                            II (internal hemorrhoids that prolapse but reduce                            spontaneously). Complications:             No immediate complications. Estimated Blood Loss:     Estimated blood loss was minimal. Impression:               - Perianal skin tags found on perianal exam.                           - Two 3 to 5 mm polyps in the sigmoid colon and in                            the descending colon, removed with a cold snare.                            Resected and retrieved.                           - Three 2 to 3 mm polyps in  the rectum and at the                            recto-sigmoid colon, removed with a cold snare.                            Resected and retrieved.                           - Non-bleeding internal hemorrhoids. Recommendation:           - Patient has a contact number available for                            emergencies. The signs and symptoms of potential                            delayed complications were discussed with the                            patient. Return to normal activities tomorrow.                            Written discharge instructions were provided to the                            patient.                           - Resume previous diet.                           - Continue present medications.                           - Await pathology results.                           - Repeat colonoscopy for surveillance based on                            pathology results.                           - Return to GI clinic PRN.                           - Internal hemorrhoids were noted on this study and                            may be amenable to hemorrhoid band ligation. If you                            are interested in further treatment of these                            hemorrhoids with band ligation,  please contact my                            clinic to set up an appointment for evaluation and                            treatment. Gerrit Heck, MD 10/07/2020 12:31:52 PM

## 2020-10-07 NOTE — Progress Notes (Addendum)
VS by [place].  previsit done by BG.  Pt states no changes to health hx since visit

## 2020-10-07 NOTE — Progress Notes (Signed)
Called to room to assist during endoscopic procedure.  Patient ID and intended procedure confirmed with present staff. Received instructions for my participation in the procedure from the performing physician.  

## 2020-10-07 NOTE — Patient Instructions (Signed)
Handouts on polyps & hemorrhoids given to you today  Handout on band ligation for hemorrhoids given to you today   Await pathology results on polyps removed     YOU HAD AN ENDOSCOPIC PROCEDURE TODAY AT [facility]:   Refer to the procedure report that was given to you for any specific questions about what was found during the examination.  If the procedure report does not answer your questions, please call your gastroenterologist to clarify.  If you requested that your care partner not be given the details of your procedure findings, then the procedure report has been included in a sealed envelope for you to review at your convenience later.  YOU SHOULD EXPECT: Some feelings of bloating in the abdomen. Passage of more gas than usual.  Walking can help get rid of the air that was put into your GI tract during the procedure and reduce the bloating. If you had a lower endoscopy (such as a colonoscopy or flexible sigmoidoscopy) you may notice spotting of blood in your stool or on the toilet paper. If you underwent a bowel prep for your procedure, you may not have a normal bowel movement for a few days.  Please Note:  You might notice some irritation and congestion in your nose or some drainage.  This is from the oxygen used during your procedure.  There is no need for concern and it should clear up in a day or so.  SYMPTOMS TO REPORT IMMEDIATELY:   Following lower endoscopy (colonoscopy or flexible sigmoidoscopy):  Excessive amounts of blood in the stool  Significant tenderness or worsening of abdominal pains  Swelling of the abdomen that is new, acute  Fever of 100F or higher    For urgent or emergent issues, a gastroenterologist can be reached at any hour by calling [phone]. Do not use MyChart messaging for urgent concerns.    DIET:  We do recommend a small meal at first, but then you may proceed to your regular diet.  Drink plenty of fluids but you should  avoid alcoholic beverages for 24 hours.  ACTIVITY:  You should plan to take it easy for the rest of today and you should NOT DRIVE or use heavy machinery until tomorrow (because of the sedation medicines used during the test).    FOLLOW UP: Our staff will call the number listed on your records 48-72 hours following your procedure to check on you and address any questions or concerns that you may have regarding the information given to you following your procedure. If we do not reach you, we will leave a message.  We will attempt to reach you two times.  During this call, we will ask if you have developed any symptoms of COVID 19. If you develop any symptoms (ie: fever, flu-like symptoms, shortness of breath, cough etc.) before then, please call [phone].  If you test positive for Covid 19 in the 2 weeks post procedure, please call and report this information to [place].    If any biopsies were taken you will be contacted by phone or by letter within the next 1-3 weeks.  Please call us at [phone] if you have not heard about the biopsies in 3 weeks.    SIGNATURES/CONFIDENTIALITY: You and/or your care partner have signed paperwork which will be entered into your electronic medical record.  These signatures attest to the fact that that the information above on your After Visit Summary has been reviewed and is understood.  Full responsibility of the confidentiality of this discharge information lies with you and/or your care-partner.

## 2020-10-09 ENCOUNTER — Telehealth: Payer: Self-pay

## 2020-10-09 NOTE — Telephone Encounter (Signed)
Covid-19 screening questions   Do you now or have you had a fever in the last 14 days? No.  Do you have any respiratory symptoms of shortness of breath or cough now or in the last 14 days? No. Do you have any family members or close contacts with diagnosed or suspected Covid-19 in the past 14 days? No. Have you been tested for Covid-19 and found to be positive? No.       Follow up Call-  Call back number 10/07/2020  Post procedure Call Back phone  # [phone]  Permission to leave phone message Yes  Some recent data might be hidden     Patient questions:  Do you have a fever, pain , or abdominal swelling? No. Pain Score  0 *  Have you tolerated food without any problems? Yes.    Have you been able to return to your normal activities? Yes.    Do you have any questions about your discharge instructions: Diet   No. Medications  No. Follow up visit  No.  Do you have questions or concerns about your Care? No.  Actions: * If pain score is 4 or above: No action needed, pain <4.

## 2020-10-13 ENCOUNTER — Ambulatory Visit: Payer: BC Managed Care – PPO

## 2020-10-16 ENCOUNTER — Telehealth: Payer: Self-pay | Admitting: General Surgery

## 2020-10-16 NOTE — Telephone Encounter (Signed)
Notified the patient of her colon results and the recall for 7 years for survelliance. The patient verbalized understanding.

## 2020-10-16 NOTE — Telephone Encounter (Signed)
-----   Message from [place], DO sent at 10/15/2020 10:46 AM EST ----- The polyps removed from the rectum were benign Hyperplastic Polyps.  These polyps harbor no malignant potential.  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  This is an uncommon, but benign finding, and does not convey any increased risk.  Based on the prior history of polyps and the benign findings of this exam, reasonable to repeat colonoscopy in 7 years for ongoing surveillance.

## 2020-11-18 ENCOUNTER — Telehealth: Payer: Self-pay | Admitting: Family Medicine

## 2020-11-18 NOTE — Telephone Encounter (Signed)
Pt is doing eat smart move more prevent diabetes program, she states that she needs to have her A1c, wt, and BP checked. She wanted to know if we can schedule her on the lab schedule? She also needs to know what her BMI is.  Please advise   Pt can be reached at the home #

## 2020-11-18 NOTE — Telephone Encounter (Signed)
Pt would need an office visit to have all of this addressed

## 2020-11-19 NOTE — Telephone Encounter (Signed)
Appt scheduled for patient on 12/11/20 at 2

## 2020-11-20 ENCOUNTER — Other Ambulatory Visit: Payer: Self-pay

## 2020-11-20 ENCOUNTER — Ambulatory Visit
Admission: RE | Admit: 2020-11-20 | Discharge: 2020-11-20 | Disposition: A | Discharge status: Home / Self Care | Payer: Self-pay | Source: Ambulatory Visit | Attending: Obstetrics & Gynecology | Admitting: Obstetrics & Gynecology

## 2020-11-20 DIAGNOSIS — Z1231 Encounter for screening mammogram for malignant neoplasm of breast: Secondary | ICD-10-CM

## 2020-12-11 ENCOUNTER — Encounter: Payer: Self-pay | Admitting: Family Medicine

## 2020-12-11 ENCOUNTER — Other Ambulatory Visit: Payer: Self-pay

## 2020-12-11 ENCOUNTER — Ambulatory Visit: Payer: BC Managed Care – PPO | Admitting: Family Medicine

## 2020-12-11 VITALS — BP 120/80 | HR 87 | Temp 97.5°F | Resp 17 | Ht 66.0 in | Wt 179.4 lb

## 2020-12-11 DIAGNOSIS — E663 Overweight: Secondary | ICD-10-CM

## 2020-12-11 DIAGNOSIS — E559 Vitamin D deficiency, unspecified: Secondary | ICD-10-CM | POA: Diagnosis not present

## 2020-12-11 NOTE — Assessment & Plan Note (Signed)
Pt's BMI 28.96  Pt is now enrolled in 'Eat Smart, Move More, Prevent Diabetes' program through the state.  Check labs as baseline for the program.  Applauded her efforts to improve her health.  Will follow

## 2020-12-11 NOTE — Progress Notes (Signed)
   Subjective:    Patient ID: Hannah Ayala, female    DOB: 05-09-1961, 60 y.o.   MRN: [id number]  HPI 'eat smart, move more, prevent diabetes' program through the state of [place].  Pt reports that based on her online answers to a survey she qualifies for program.  Pt just finished 4th week of year long program.  Pt is down 4 lbs since December.  No CP, SOB, HAs, visual changes, edema.  Vit D deficiency- pt has hx of this.  Wants to 'see where I am'.   Review of Systems For ROS see HPI   This visit occurred during the SARS-CoV-2 public health emergency.  Safety protocols were in place, including screening questions prior to the visit, additional usage of staff PPE, and extensive cleaning of exam room while observing appropriate contact time as indicated for disinfecting solutions.       Objective:   Physical Exam Vitals reviewed.  Constitutional:      General: She is not in acute distress.    Appearance: Normal appearance. She is well-developed and well-nourished.  HENT:     Head: Normocephalic and atraumatic.  Eyes:     Extraocular Movements: EOM normal.     Conjunctiva/sclera: Conjunctivae normal.     Pupils: Pupils are equal, round, and reactive to light.  Neck:     Thyroid: No thyromegaly.  Cardiovascular:     Rate and Rhythm: Normal rate and regular rhythm.     Pulses: Normal pulses and intact distal pulses.     Heart sounds: Normal heart sounds. No murmur heard.   Pulmonary:     Effort: Pulmonary effort is normal. No respiratory distress.     Breath sounds: Normal breath sounds.  Abdominal:     General: There is no distension.     Palpations: Abdomen is soft.     Tenderness: There is no abdominal tenderness.  Musculoskeletal:        General: No edema.     Cervical back: Normal range of motion and neck supple.     Right lower leg: No edema.     Left lower leg: No edema.  Lymphadenopathy:     Cervical: No cervical adenopathy.  Skin:    General: Skin is  warm and dry.  Neurological:     Mental Status: She is alert and oriented to person, place, and time.  Psychiatric:        Mood and Affect: Mood and affect normal.        Behavior: Behavior normal.           Assessment & Plan:

## 2020-12-11 NOTE — Assessment & Plan Note (Signed)
Pt has hx of this.  Check labs and replete prn. 

## 2020-12-11 NOTE — Patient Instructions (Addendum)
Schedule your complete physical in July We'll notify you of your lab results and make any changes if needed Continue to work on healthy diet and regular exercise- you're doing great! Call with any questions or concerns Stay Safe!  Stay Healthy! Happy Early Rudene Anda!!

## 2020-12-12 LAB — BASIC METABOLIC PANEL
BUN: 12 mg/dL (ref 6–23)
CO2: 25 mEq/L (ref 19–32)
Calcium: 9.2 mg/dL (ref 8.4–10.5)
Chloride: 103 mEq/L (ref 96–112)
Creatinine, Ser: 0.75 mg/dL (ref 0.40–1.20)
GFR: 88.03 mL/min (ref >60.00)
Glucose, Bld: 91 mg/dL (ref 70–99)
Potassium: 4 mEq/L (ref 3.5–5.1)
Sodium: 137 mEq/L (ref 135–145)

## 2020-12-12 LAB — VITAMIN D 25 HYDROXY (VIT D DEFICIENCY, FRACTURES): VITD: 26.63 ng/mL — ABNORMAL LOW (ref 30.00–100.00)

## 2020-12-12 LAB — HEMOGLOBIN A1C: Hgb A1c MFr Bld: 6 % (ref 4.6–6.5)

## 2020-12-16 ENCOUNTER — Other Ambulatory Visit: Payer: Self-pay

## 2020-12-16 DIAGNOSIS — E559 Vitamin D deficiency, unspecified: Secondary | ICD-10-CM

## 2020-12-16 MED ORDER — VITAMIN D (ERGOCALCIFEROL) 1.25 MG (50000 UNIT) PO CAPS: 50000.0000 [IU] | ORAL_CAPSULE | ORAL | 0 refills | Status: DC

## 2021-03-14 ENCOUNTER — Other Ambulatory Visit: Payer: Self-pay | Admitting: Family Medicine

## 2021-03-14 DIAGNOSIS — E559 Vitamin D deficiency, unspecified: Secondary | ICD-10-CM

## 2021-05-15 ENCOUNTER — Encounter: Payer: BC Managed Care – PPO | Admitting: Family Medicine

## 2021-07-23 ENCOUNTER — Other Ambulatory Visit: Payer: Self-pay

## 2021-07-23 ENCOUNTER — Telehealth: Payer: Self-pay | Admitting: *Deleted

## 2021-07-23 ENCOUNTER — Encounter: Payer: Self-pay | Admitting: Obstetrics & Gynecology

## 2021-07-23 ENCOUNTER — Ambulatory Visit (INDEPENDENT_AMBULATORY_CARE_PROVIDER_SITE_OTHER): Payer: BC Managed Care – PPO | Admitting: Obstetrics & Gynecology

## 2021-07-23 VITALS — BP 110/72 | HR 78 | Resp 16 | Ht 65.75 in | Wt 180.0 lb

## 2021-07-23 DIAGNOSIS — Z01419 Encounter for gynecological examination (general) (routine) without abnormal findings: Secondary | ICD-10-CM | POA: Diagnosis not present

## 2021-07-23 DIAGNOSIS — E663 Overweight: Secondary | ICD-10-CM

## 2021-07-23 DIAGNOSIS — Z87448 Personal history of other diseases of urinary system: Secondary | ICD-10-CM

## 2021-07-23 DIAGNOSIS — Z90710 Acquired absence of both cervix and uterus: Secondary | ICD-10-CM

## 2021-07-23 DIAGNOSIS — Z780 Asymptomatic menopausal state: Secondary | ICD-10-CM

## 2021-07-23 NOTE — Telephone Encounter (Signed)
-----   Message from Princess Bruins, MD sent at 07/23/2021 12:59 PM EDT ----- Regarding: Refer to Urology Hematuria.  Uro for investigation and management.

## 2021-07-23 NOTE — Progress Notes (Addendum)
Hannah Ayala 03-15-1961 [id number]   History:    60 y.o.  G2P2L2 Divorced x many yrs.     RP:  Established patient presenting for annual gyn exam    HPI: S/P Total Hysterectomy for Fibroids.  No pelvic pain.  No menopausal Sx.  Abstinent x many yrs.  Breasts normal.  Urine normal, h/o hematuria.  BMs normal.  BMI 29.27.  Walking.  Father with Prostate Cancer.  Health Labs with Dr Birdie Riddle.   Past medical history,surgical history, family history and social history were all reviewed and documented in the EPIC chart.  Gynecologic History No LMP recorded. Patient has had a hysterectomy.  Obstetric History OB History  Gravida Para Term Preterm AB Living  2 2       2   SAB IAB Ectopic Multiple Live Births               # Outcome Date GA Lbr Len/2nd Weight Sex Delivery Anes PTL Lv  2 Para           1 Para              ROS: A ROS was performed and pertinent positives and negatives are included in the history.  GENERAL: No fevers or chills. HEENT: No change in vision, no earache, sore throat or sinus congestion. NECK: No pain or stiffness. CARDIOVASCULAR: No chest pain or pressure. No palpitations. PULMONARY: No shortness of breath, cough or wheeze. GASTROINTESTINAL: No abdominal pain, nausea, vomiting or diarrhea, melena or bright red blood per rectum. GENITOURINARY: No urinary frequency, urgency, hesitancy or dysuria. MUSCULOSKELETAL: No joint or muscle pain, no back pain, no recent trauma. DERMATOLOGIC: No rash, no itching, no lesions. ENDOCRINE: No polyuria, polydipsia, no heat or cold intolerance. No recent change in weight. HEMATOLOGICAL: No anemia or easy bruising or bleeding. NEUROLOGIC: No headache, seizures, numbness, tingling or weakness. PSYCHIATRIC: No depression, no loss of interest in normal activity or change in sleep pattern.     Exam:   BP 110/72   Pulse 78   Resp 16   Ht 5' 5.75" (1.67 m)   Wt 180 lb (81.6 kg)   BMI 29.27 kg/m   Body mass index is  29.27 kg/m.  General appearance : Well developed well nourished female. No acute distress HEENT: Eyes: no retinal hemorrhage or exudates,  Neck supple, trachea midline, no carotid bruits, no thyroidmegaly Lungs: Clear to auscultation, no rhonchi or wheezes, or rib retractions  Heart: Regular rate and rhythm, no murmurs or gallops Breast:Examined in sitting and supine position were symmetrical in appearance, no palpable masses or tenderness,  no skin retraction, no nipple inversion, no nipple discharge, no skin discoloration, no axillary or supraclavicular lymphadenopathy Abdomen: no palpable masses or tenderness, no rebound or guarding Extremities: no edema or skin discoloration or tenderness  Pelvic: Vulva: Normal             Vagina: No gross lesions or discharge.  Pap reflex done.  Cervix/Uterus absent  Adnexa  Without masses or tenderness  Anus: Normal  Urine analysis: Dark yellow slightly cloudy, protein negative, nitrites negative, white blood cells 0-5, red blood cells 10-20, bacteria few.  Urine culture pending.   Assessment/Plan:  60 y.o. female for annual exam   1. Well female exam with routine gynecological exam Gynecologic exam status post total hysterectomy.  No indication for Pap test at this time.  Breast exam normal.  Screening mammogram January 2022 was negative.  Colonoscopy in 2021.  Health  labs with family physician.  2. S/P total hysterectomy  3. Postmenopause Well on no HRT.  Vitamin D supplements, calcium intake of 1.5 g/day total and regular weightbearing physical activities recommended.  4. History of hematuria Microscopic hematuria with red blood cells at 10-20 per high-power field.  Decision to refer to urology. - Urinalysis,Complete w/RFL Culture  5. Overweight (BMI 25.0-29.9)  Low calorie/carb diet.  Aerobic activities 5 times a week and light weight lifting every 2 days.  Princess Bruins MD, 12:27 PM 07/23/2021

## 2021-07-23 NOTE — Telephone Encounter (Signed)
Office notes printed and will be faxed when u/a returns.

## 2021-07-25 LAB — URINALYSIS, COMPLETE W/RFL CULTURE
Bilirubin Urine: NEGATIVE
Casts: NONE SEEN /LPF
Crystals: NONE SEEN /HPF
Glucose, UA: NEGATIVE
Hyaline Cast: NONE SEEN /LPF
Ketones, ur: NEGATIVE
Leukocyte Esterase: NEGATIVE
Nitrites, Initial: NEGATIVE
Protein, ur: NEGATIVE
Specific Gravity, Urine: 1.025 (ref 1.001–1.035)
Yeast: NONE SEEN /HPF
pH: 6.5 (ref 5.0–8.0)

## 2021-07-25 LAB — URINE CULTURE
MICRO NUMBER:: 12439933
Result:: NO GROWTH
SPECIMEN QUALITY:: ADEQUATE

## 2021-07-25 LAB — CULTURE INDICATED

## 2021-07-27 NOTE — Telephone Encounter (Signed)
Office notes faxed to [facility] they will call to schedule. Patient informed of this as well.

## 2021-08-07 ENCOUNTER — Encounter: Payer: Self-pay | Admitting: Family Medicine

## 2021-08-07 ENCOUNTER — Other Ambulatory Visit: Payer: Self-pay

## 2021-08-07 ENCOUNTER — Ambulatory Visit (INDEPENDENT_AMBULATORY_CARE_PROVIDER_SITE_OTHER): Payer: BC Managed Care – PPO | Admitting: Family Medicine

## 2021-08-07 VITALS — BP 120/82 | HR 94 | Temp 97.3°F | Resp 16 | Ht 65.75 in | Wt 184.8 lb

## 2021-08-07 DIAGNOSIS — R499 Unspecified voice and resonance disorder: Secondary | ICD-10-CM

## 2021-08-07 DIAGNOSIS — E669 Obesity, unspecified: Secondary | ICD-10-CM

## 2021-08-07 DIAGNOSIS — Z1159 Encounter for screening for other viral diseases: Secondary | ICD-10-CM

## 2021-08-07 DIAGNOSIS — Z0000 Encounter for general adult medical examination without abnormal findings: Secondary | ICD-10-CM | POA: Diagnosis not present

## 2021-08-07 DIAGNOSIS — E559 Vitamin D deficiency, unspecified: Secondary | ICD-10-CM | POA: Diagnosis not present

## 2021-08-07 DIAGNOSIS — Z23 Encounter for immunization: Secondary | ICD-10-CM | POA: Diagnosis not present

## 2021-08-07 DIAGNOSIS — Z114 Encounter for screening for human immunodeficiency virus [HIV]: Secondary | ICD-10-CM

## 2021-08-07 LAB — BASIC METABOLIC PANEL
BUN: 12 mg/dL (ref 6–23)
CO2: 25 mEq/L (ref 19–32)
Calcium: 9.7 mg/dL (ref 8.4–10.5)
Chloride: 102 mEq/L (ref 96–112)
Creatinine, Ser: 0.74 mg/dL (ref 0.40–1.20)
GFR: 89.05 mL/min (ref >60.00)
Glucose, Bld: 93 mg/dL (ref 70–99)
Potassium: 4.3 mEq/L (ref 3.5–5.1)
Sodium: 136 mEq/L (ref 135–145)

## 2021-08-07 LAB — TSH: TSH: 0.43 u[IU]/mL (ref 0.35–5.50)

## 2021-08-07 LAB — CBC WITH DIFFERENTIAL/PLATELET
Basophils Absolute: 0 10*3/uL (ref 0.0–0.1)
Basophils Relative: 0.7 % (ref 0.0–3.0)
Eosinophils Absolute: 0.2 10*3/uL (ref 0.0–0.7)
Eosinophils Relative: 3.8 % (ref 0.0–5.0)
HCT: 41.4 % (ref 36.0–46.0)
Hemoglobin: 13.7 g/dL (ref 12.0–15.0)
Lymphocytes Relative: 28.6 % (ref 12.0–46.0)
Lymphs Abs: 1.5 10*3/uL (ref 0.7–4.0)
MCHC: 33.2 g/dL (ref 30.0–36.0)
MCV: 80.5 fl (ref 78.0–100.0)
Monocytes Absolute: 0.5 10*3/uL (ref 0.1–1.0)
Monocytes Relative: 8.8 % (ref 3.0–12.0)
Neutro Abs: 3 10*3/uL (ref 1.4–7.7)
Neutrophils Relative %: 58.1 % (ref 43.0–77.0)
Platelets: 263 10*3/uL (ref 150.0–400.0)
RBC: 5.15 Mil/uL — ABNORMAL HIGH (ref 3.87–5.11)
RDW: 15 % (ref 11.5–15.5)
WBC: 5.2 10*3/uL (ref 4.0–10.5)

## 2021-08-07 LAB — LIPID PANEL
Cholesterol: 179 mg/dL (ref 0–200)
HDL: 49.9 mg/dL (ref >39.00)
LDL Cholesterol: 111 mg/dL — ABNORMAL HIGH (ref 0–99)
NonHDL: 129.57
Total CHOL/HDL Ratio: 4
Triglycerides: 93 mg/dL (ref 0.0–149.0)
VLDL: 18.6 mg/dL (ref 0.0–40.0)

## 2021-08-07 LAB — HEPATIC FUNCTION PANEL
ALT: 13 U/L (ref 0–35)
AST: 15 U/L (ref 0–37)
Albumin: 4.6 g/dL (ref 3.5–5.2)
Alkaline Phosphatase: 116 U/L (ref 39–117)
Bilirubin, Direct: 0.2 mg/dL (ref 0.0–0.3)
Total Bilirubin: 1.2 mg/dL (ref 0.2–1.2)
Total Protein: 7.1 g/dL (ref 6.0–8.3)

## 2021-08-07 LAB — VITAMIN D 25 HYDROXY (VIT D DEFICIENCY, FRACTURES): VITD: 23.2 ng/mL — ABNORMAL LOW (ref 30.00–100.00)

## 2021-08-07 NOTE — Assessment & Plan Note (Signed)
Check labs and replete prn. 

## 2021-08-07 NOTE — Progress Notes (Signed)
   Subjective:    Patient ID: Hannah Ayala, female    DOB: [birth year], 60 y.o.   MRN: [id number]  HPI CPE- UTD on mammo, colonoscopy, Tdap.  Will get flu today.  No need for pap due to TAH.  Patient Care Team    Relationship Specialty Notifications Start End  Midge Minium, MD PCP - General Family Medicine  10/19/17   Eldred Manges, MD (Inactive) Consulting Physician Obstetrics and Gynecology  10/19/17     Health Maintenance  Topic Date Due   HIV Screening  Never done   Hepatitis C Screening  Never done   COVID-19 Vaccine (3 - Booster for Pfizer series) 06/15/2020   INFLUENZA VACCINE  05/25/2021   Zoster Vaccines- Shingrix (1 of 2) 11/07/2021 (Originally 12/17/2012)   TETANUS/TDAP  08/22/2021   MAMMOGRAM  11/20/2022   COLONOSCOPY (Pts 45-42yrs Insurance coverage will need to be confirmed)  10/08/2027   HPV VACCINES  Aged Out      Review of Systems Patient reports no vision/ hearing changes, adenopathy,fever, persistant/recurrent hoarseness , swallowing issues, chest pain, palpitations, edema, persistant/recurrent cough, hemoptysis, dyspnea (rest/exertional/paroxysmal nocturnal), gastrointestinal bleeding (melena, rectal bleeding), abdominal pain, significant heartburn, bowel changes, GU symptoms (dysuria, hematuria, incontinence), Gyn symptoms (abnormal  bleeding, pain),  syncope, focal weakness, memory loss, numbness & tingling, skin/hair/nail changes, abnormal bruising or bleeding, anxiety, or depression.   + 5 lb weight gain + change in voice- unable to sing like before, cannot hit higher notes  This visit occurred during the SARS-CoV-2 public health emergency.  Safety protocols were in place, including screening questions prior to the visit, additional usage of staff PPE, and extensive cleaning of exam room while observing appropriate contact time as indicated for disinfecting solutions.      Objective:   Physical Exam General Appearance:    Alert,  cooperative, no distress, appears stated age  Head:    Normocephalic, without obvious abnormality, atraumatic  Eyes:    PERRL, conjunctiva/corneas clear, EOM's intact, fundi    benign, both eyes  Ears:    Normal TM's and external ear canals, both ears  Nose:   Deferred due to COVID  Throat:   Neck:   Supple, symmetrical, trachea midline, no adenopathy;    Thyroid: no enlargement/tenderness/nodules  Back:     Symmetric, no curvature, ROM normal, no CVA tenderness  Lungs:     Clear to auscultation bilaterally, respirations unlabored  Chest Wall:    No tenderness or deformity   Heart:    Regular rate and rhythm, S1 and S2 normal, no murmur, rub   or gallop  Breast Exam:    Deferred to mammo  Abdomen:     Soft, non-tender, bowel sounds active all four quadrants,    no masses, no organomegaly  Genitalia:    Deferred to GYN  Rectal:    Extremities:   Extremities normal, atraumatic, no cyanosis or edema  Pulses:   2+ and symmetric all extremities  Skin:   Skin color, texture, turgor normal, no rashes or lesions  Lymph nodes:   Cervical, supraclavicular, and axillary nodes normal  Neurologic:   CNII-XII intact, normal strength, sensation and reflexes    throughout          Assessment & Plan:

## 2021-08-07 NOTE — Patient Instructions (Addendum)
Follow up in 1 year or as needed We'll notify you of your lab results and make any changes if needed Continue to work on healthy diet and regular exercise- you can do it!! We'll call you with your ENT appt Call with any questions or concerns Stay Safe!  Stay Healthy! Happy Fall!!

## 2021-08-07 NOTE — Assessment & Plan Note (Signed)
New.  Pt's BMI is now 30.05 after gaining 5 lbs since last visit.  Check labs to risk stratify.  Will follow.

## 2021-08-07 NOTE — Assessment & Plan Note (Signed)
Pt's PE WNL w/ exception of BMI.  UTD on mammo, colonoscopy, Tdap.  Flu shot given today.  Check labs.  Anticipatory guidance provided.

## 2021-08-10 LAB — HEPATITIS C ANTIBODY
Hepatitis C Ab: NONREACTIVE
SIGNAL TO CUT-OFF: 0.1

## 2021-08-10 LAB — HIV ANTIBODY (ROUTINE TESTING W REFLEX): HIV 1&2 Ab, 4th Generation: NONREACTIVE

## 2021-08-11 ENCOUNTER — Telehealth (INDEPENDENT_AMBULATORY_CARE_PROVIDER_SITE_OTHER): Payer: BC Managed Care – PPO | Admitting: Family Medicine

## 2021-08-11 ENCOUNTER — Encounter: Payer: Self-pay | Admitting: Family Medicine

## 2021-08-11 ENCOUNTER — Telehealth: Payer: Self-pay | Admitting: *Deleted

## 2021-08-11 ENCOUNTER — Other Ambulatory Visit: Payer: Self-pay

## 2021-08-11 DIAGNOSIS — E559 Vitamin D deficiency, unspecified: Secondary | ICD-10-CM

## 2021-08-11 DIAGNOSIS — U071 COVID-19: Secondary | ICD-10-CM | POA: Diagnosis not present

## 2021-08-11 MED ORDER — BENZONATATE 200 MG PO CAPS: 200.0000 mg | ORAL_CAPSULE | Freq: Two times a day (BID) | ORAL | 0 refills | Status: DC | PRN

## 2021-08-11 MED ORDER — VITAMIN D (ERGOCALCIFEROL) 1.25 MG (50000 UNIT) PO CAPS: 50000.0000 [IU] | ORAL_CAPSULE | ORAL | 0 refills | Status: DC

## 2021-08-11 NOTE — Patient Instructions (Signed)
   ---------------------------------------------------------------------------------------------------------------------------      WORK SLIP:  Patient Hannah Ayala,  [birth year], was seen for a medical visit today, 08/11/21 . Please excuse from work for a COVID like illness. We advise 10 days minimum from the onset of symptoms (08/08/21) PLUS 1 day of no fever and improved symptoms. Will defer to employer for a sooner return to work if symptoms have resolved, it is greater than 5 days since the positive test and the patient can wear a high-quality, tight fitting mask such as N95 or KN95 at all times for an additional 5 days. Would also suggest COVID19 antigen testing is negative prior to return.  Sincerely: E-signature: Dr. Colin Benton, DO [facility] Ph: [phone]   ------------------------------------------------------------------------------------------------------------------------------     HOME CARE TIPS:   -I sent the medication(s) we discussed to your pharmacy: Meds ordered this encounter  Medications   benzonatate (TESSALON) 200 MG capsule    Sig: Take 1 capsule (200 mg total) by mouth 2 (two) times daily as needed.    Dispense:  20 capsule    Refill:  0     -can use tylenol or aleve if needed for fevers, aches and pains per instructions  -can use nasal saline a few times per day if you have nasal congestion; sometimes  a short course of Afrin nasal spray for 3 days can help with symptoms as well  -stay hydrated, drink plenty of fluids and eat small healthy meals - avoid dairy  -can take 1000 IU (78mcg) Vit D3 and 100-500 mg of Vit C daily per instructions  -If the Covid test is positive, check out the [facility] website for more information on home care, transmission and treatment for COVID19  -follow up with your doctor in 2-3 days unless improving and feeling better  -stay home while sick, except to seek medical care. If you have  COVID19, ideally it would be best to stay home for a full 10 days since the onset of symptoms PLUS one day of no fever and feeling better. Wear a good mask that fits snugly (such as N95 or KN95) if around others to reduce the risk of transmission.  It was nice to meet you today, and I really hope you are feeling better soon. I help [facility] out with telemedicine visits on Tuesdays and Thursdays and am available for visits on those days. If you have any concerns or questions following this visit please schedule a follow up visit with your Primary Care doctor or seek care at a local urgent care clinic to avoid delays in care.    Seek in person care or schedule a follow up video visit promptly if your symptoms worsen, new concerns arise or you are not improving with treatment. Call 911 and/or seek emergency care if your symptoms are severe or life threatening.

## 2021-08-11 NOTE — Telephone Encounter (Signed)
Per Dr Maudie Mercury, she stated the patient requested a work note and does not want this sent via [place].  Work note was completed and mailed to the patient's home address.

## 2021-08-11 NOTE — Progress Notes (Signed)
Virtual Visit via Video Note  I connected with Jasline  on 08/11/21 at  1:00 PM EDT by a video enabled telemedicine application and verified that I am speaking with the correct person using two identifiers.  Location patient: home, [place] Location provider:work or home office Persons participating in the virtual visit: patient, provider  I discussed the limitations of evaluation and management by telemedicine and the availability of in person appointments. The patient expressed understanding and agreed to proceed.   HPI:  Acute telemedicine visit for Covid19: -Onset: 3 days ago, home covid test was positive today -Symptoms include: cough, runny nose, chest sensation when coughs, sore throat -Denies:fevers, CP, SOB, NVD, inability to eat/drink/get out of bed -Has tried:Robitussin DM and cough drops help some -Pertinent past medical history: Obesity -Pertinent medication allergies:  Allergies  Allergen Reactions   Penicillin G Other (See Comments)    uknown  -COVID-19 vaccine status: vaccinated x2  -GFR 89 10/22 labs  ROS: See pertinent positives and negatives per HPI.  Past Medical History:  Diagnosis Date   Allergy    seasonal allergies   Benign colonic polyp    History of chickenpox     Past Surgical History:  Procedure Laterality Date   ROBOTIC ASSISTED LAPAROSCOPIC HYSTERECTOMY AND SALPINGECTOMY     UMBILICAL HERNIA REPAIR     as an infant   WISDOM TOOTH EXTRACTION       Current Outpatient Medications:    benzonatate (TESSALON) 200 MG capsule, Take 1 capsule (200 mg total) by mouth 2 (two) times daily as needed., Disp: 20 capsule, Rfl: 0   VITAMIN D PO, Take by mouth., Disp: , Rfl:    Vitamin D, Ergocalciferol, (DRISDOL) 1.25 MG (50000 UNIT) CAPS capsule, Take 1 capsule (50,000 Units total) by mouth every 7 (seven) days., Disp: 12 capsule, Rfl: 0  EXAM:  VITALS per patient if applicable:  GENERAL: alert, oriented, appears well and in no acute distress  HEENT:  atraumatic, conjunttiva clear, no obvious abnormalities on inspection of external nose and ears  NECK: normal movements of the head and neck  LUNGS: on inspection no signs of respiratory distress, breathing rate appears normal, no obvious gross SOB, gasping or wheezing  CV: no obvious cyanosis  MS: moves all visible extremities without noticeable abnormality  PSYCH/NEURO: pleasant and cooperative, no obvious depression or anxiety, speech and thought processing grossly intact  ASSESSMENT AND PLAN:  Discussed the following assessment and plan:  COVID-19   Discussed treatment options (infusions and oral options and risk of drug interactions), ideal treatment window, potential complications, isolation and precautions for COVID-19.  Discussed possibility of rebound with antivirals and the need to reisolate if it should occur for 5 days. Checked for/reviewed any labs done in the last 90 days with GFR listed in HPI if available.  After lengthy discussion, the patient declined referral for Covid outpatient treatment at this time. The patient did want a prescription for cough, Tessalon Rx sent.  Other symptomatic care measures summarized in patient instructions. Work/School slipped offered: provided in patient instructions   Advised to seek prompt in person care if worsening, new symptoms arise, or if is not improving with treatment. Discussed options for inperson care if PCP office not available. Did let this patient know that I only do telemedicine on Tuesdays and Thursdays for [place]. Advised to schedule follow up visit with PCP or [facility] if any further questions or concerns to avoid delays in care.   I discussed the assessment and treatment plan  with the patient. The patient was provided an opportunity to ask questions and all were answered. The patient agreed with the plan and demonstrated an understanding of the instructions.     Lucretia Kern, DO

## 2021-08-12 NOTE — Telephone Encounter (Signed)
[facility] states they did not receive the referral. I received a confirmation the 1st time. Office notes re-faxed.

## 2021-08-25 NOTE — Telephone Encounter (Signed)
Patient scheduled on 09/14/21 @ 1:15pm with Dr.Herrick.

## 2021-10-31 ENCOUNTER — Other Ambulatory Visit: Payer: Self-pay | Admitting: Family Medicine

## 2021-10-31 DIAGNOSIS — E559 Vitamin D deficiency, unspecified: Secondary | ICD-10-CM

## 2021-11-03 ENCOUNTER — Other Ambulatory Visit: Payer: Self-pay | Admitting: Obstetrics & Gynecology

## 2021-11-03 DIAGNOSIS — Z1231 Encounter for screening mammogram for malignant neoplasm of breast: Secondary | ICD-10-CM

## 2021-11-09 ENCOUNTER — Telehealth: Payer: Self-pay | Admitting: Family Medicine

## 2021-11-09 NOTE — Telephone Encounter (Signed)
Pt is asking for your thoughts on the COVID booster as her last was end of October, she also wants your thoughts on the Keto diet. Would you prefer an appt to discuss these topics?

## 2021-11-09 NOTE — Telephone Encounter (Signed)
Pt called in asking what are Dr. Virgil Benedict thoughts on the Keto diet. She also wanted to know if she can get her covid booster she states that she had covid at the end of October.  Please advise   Home #

## 2021-11-09 NOTE — Telephone Encounter (Signed)
It would be best to discuss w/ an appt since there are multiple issues/concerns/areas needing advice

## 2021-11-10 NOTE — Telephone Encounter (Signed)
Pt has been scheduled.  °

## 2021-11-19 ENCOUNTER — Ambulatory Visit (INDEPENDENT_AMBULATORY_CARE_PROVIDER_SITE_OTHER): Payer: BC Managed Care – PPO | Admitting: Family Medicine

## 2021-11-19 ENCOUNTER — Encounter: Payer: Self-pay | Admitting: Family Medicine

## 2021-11-19 VITALS — BP 132/70 | HR 112 | Temp 97.9°F | Resp 16 | Wt 184.6 lb

## 2021-11-19 DIAGNOSIS — E669 Obesity, unspecified: Secondary | ICD-10-CM

## 2021-11-19 DIAGNOSIS — Z7185 Encounter for immunization safety counseling: Secondary | ICD-10-CM | POA: Insufficient documentation

## 2021-11-19 NOTE — Progress Notes (Signed)
° °  Subjective:    Patient ID: Hannah Ayala, female    DOB: 1961-06-13, 61 y.o.   MRN: [id number]  HPI Obesity- pt's weight is unchanged from October.  BMI 30.02.  Pt is considering a low carb diet.  She admits to loving sweets and is concerned that she won't be able to sustain low sugar diet.  Wants to know if there is a problem increasing amount of protein and fat in her diet.  COVID booster- pt had COVID at the end of October, wants to know if she should get booster   Review of Systems For ROS see HPI   This visit occurred during the SARS-CoV-2 public health emergency.  Safety protocols were in place, including screening questions prior to the visit, additional usage of staff PPE, and extensive cleaning of exam room while observing appropriate contact time as indicated for disinfecting solutions.      Objective:   Physical Exam Constitutional:      General: She is not in acute distress.    Appearance: Normal appearance. She is not ill-appearing.  HENT:     Head: Normocephalic and atraumatic.  Eyes:     Extraocular Movements: Extraocular movements intact.     Conjunctiva/sclera: Conjunctivae normal.     Pupils: Pupils are equal, round, and reactive to light.  Skin:    General: Skin is warm and dry.  Neurological:     General: No focal deficit present.     Mental Status: She is alert and oriented to person, place, and time.  Psychiatric:        Mood and Affect: Mood normal.        Behavior: Behavior normal.        Thought Content: Thought content normal.          Assessment & Plan:

## 2021-11-19 NOTE — Patient Instructions (Signed)
Follow up as needed or as scheduled Try and follow a low carb diet Divide the plate into 3/6K- 1/2 for fruits and veggies, 1/2 for protein, 1/2 for carbs It is all about balance.  Not restrictions. Get your COVID booster at your convenience Call with any questions or concerns Have a great weekend!!!

## 2021-11-23 ENCOUNTER — Encounter: Payer: Self-pay | Admitting: Family Medicine

## 2021-11-23 ENCOUNTER — Ambulatory Visit
Admission: RE | Admit: 2021-11-23 | Discharge: 2021-11-23 | Disposition: A | Discharge status: Home / Self Care | Payer: BC Managed Care – PPO | Source: Ambulatory Visit | Attending: Obstetrics & Gynecology | Admitting: Obstetrics & Gynecology

## 2021-11-23 DIAGNOSIS — Z1231 Encounter for screening mammogram for malignant neoplasm of breast: Secondary | ICD-10-CM

## 2021-11-23 NOTE — Assessment & Plan Note (Signed)
Pt's weight is unchanged from her October visit.  She is considering starting a low carb diet to lose weight.  She is fearful that she won't be able to do it b/c she self admittedly loves sweets.  Discussed natural sugars- fruit- and sugar substitutes like Stevia.  Encouraged low carb but not no carb.  Discussed plant based protein options as well as lean meats.  Will follow.

## 2021-11-23 NOTE — Assessment & Plan Note (Signed)
Pt instructed to get COVID booster at her convenience

## 2022-05-12 ENCOUNTER — Telehealth: Payer: Self-pay | Admitting: Family Medicine

## 2022-05-12 NOTE — Telephone Encounter (Signed)
Spoke w/ pt . I advised she can try some Calamine lotion or Benadryl for the itching . I informed her that if that does not help she will need to call the office and make a virtual visit or an in person visit with Dr Birdie Riddle . She expressed verbal understanding

## 2022-05-12 NOTE — Telephone Encounter (Signed)
(  Dr. Birdie Riddle pt) PT called stating she has been bitten by something and she can stop itching. I told pt she need a ppt,  pt denied appt want to talk with a nurse. Please advise

## 2022-06-11 ENCOUNTER — Telehealth: Payer: Self-pay | Admitting: Family Medicine

## 2022-06-11 NOTE — Telephone Encounter (Signed)
Caller name: Oliviarose Punch   On DPR? :yes/no: Yes  Call back number: [phone]  Provider they see:  Birdie Riddle   Reason for call: pt need to talk to a nurse about the flu shot and Covid shot

## 2022-06-11 NOTE — Telephone Encounter (Signed)
Called patient 06/11/2022 at 3:15 and informed her that she can get the flu shot and the Covid shot at the sametime 4 weeks before travel

## 2022-07-02 ENCOUNTER — Telehealth: Payer: Self-pay | Admitting: Family Medicine

## 2022-07-02 NOTE — Telephone Encounter (Signed)
Pt daughter called and states her father had a heart cath and he needs stents .  Father name is Hannah Ayala DOB 12/25/33. We do not see her parents she is our pt the daughter [name] . She is looking for recommendation from Dr Birdie Riddle on a Internal medicine or someone specialize in geriatric's .  Parents live in [place] and would like to stay close by if possible but she is asking if DR Birdie Riddle can recommend some one for them . Please advise

## 2022-07-02 NOTE — Telephone Encounter (Signed)
Caller name: [place]   On DPR? :yes/no: Yes  Call back number: [phone]  Provider they see: Birdie Riddle  Reason for call: pt calling for recommendations for geriatric specialists for her parents. (Mom 7, Dad 37)  Please advise.

## 2022-07-05 NOTE — Telephone Encounter (Signed)
[facility] is a great option for older adults

## 2022-07-05 NOTE — Telephone Encounter (Signed)
I would recommend any of the providers at [facility] but I'm not sure who is taking new patients.

## 2022-07-05 NOTE — Telephone Encounter (Signed)
Called back and informed  

## 2022-07-05 NOTE — Telephone Encounter (Signed)
Pt daughter wants a recommendation who is best to care for him does not matter if they're in [place]. Daughter wants someone specialized in geriatric care or cardiac care due to recommendation for open heart on her father

## 2022-07-30 ENCOUNTER — Ambulatory Visit: Payer: BC Managed Care – PPO | Admitting: Obstetrics & Gynecology

## 2022-08-06 ENCOUNTER — Ambulatory Visit (INDEPENDENT_AMBULATORY_CARE_PROVIDER_SITE_OTHER): Payer: BC Managed Care – PPO | Admitting: Obstetrics & Gynecology

## 2022-08-06 ENCOUNTER — Encounter: Payer: Self-pay | Admitting: Obstetrics & Gynecology

## 2022-08-06 VITALS — BP 94/68 | HR 106 | Ht 66.0 in | Wt 190.0 lb

## 2022-08-06 DIAGNOSIS — Z01419 Encounter for gynecological examination (general) (routine) without abnormal findings: Secondary | ICD-10-CM | POA: Diagnosis not present

## 2022-08-06 DIAGNOSIS — Z90710 Acquired absence of both cervix and uterus: Secondary | ICD-10-CM | POA: Diagnosis not present

## 2022-08-06 DIAGNOSIS — Z780 Asymptomatic menopausal state: Secondary | ICD-10-CM

## 2022-08-06 NOTE — Progress Notes (Signed)
Hannah Ayala 1960/12/04 [id number]   History:    61 y.o. G2P2L2 Divorced x many yrs.  Leaving for a trip to [place] tomorrow :)   RP:  Established patient presenting for annual gyn exam    HPI: S/P Total Hysterectomy for Fibroids.  No pelvic pain.  No menopausal Sx.  Abstinent x many yrs.  Pap Neg 06/2019.  No h/o abnormal Pap.  Repeat Pap at 7 yrs.  Breasts normal. Mammo Neg 10/2021. Urine normal.  BMs normal.  Colono 09/2020. BMI 30.67.  Walking. Father with Prostate Cancer.  Health Labs with Dr Birdie Riddle.  Past medical history,surgical history, family history and social history were all reviewed and documented in the EPIC chart.  Gynecologic History No LMP recorded. Patient has had a hysterectomy.  Obstetric History OB History  Gravida Para Term Preterm AB Living  '2 2       2  '$ SAB IAB Ectopic Multiple Live Births               # Outcome Date GA Lbr Len/2nd Weight Sex Delivery Anes PTL Lv  2 Para           1 Para              ROS: A ROS was performed and pertinent positives and negatives are included in the history. GENERAL: No fevers or chills. HEENT: No change in vision, no earache, sore throat or sinus congestion. NECK: No pain or stiffness. CARDIOVASCULAR: No chest pain or pressure. No palpitations. PULMONARY: No shortness of breath, cough or wheeze. GASTROINTESTINAL: No abdominal pain, nausea, vomiting or diarrhea, melena or bright red blood per rectum. GENITOURINARY: No urinary frequency, urgency, hesitancy or dysuria. MUSCULOSKELETAL: No joint or muscle pain, no back pain, no recent trauma. DERMATOLOGIC: No rash, no itching, no lesions. ENDOCRINE: No polyuria, polydipsia, no heat or cold intolerance. No recent change in weight. HEMATOLOGICAL: No anemia or easy bruising or bleeding. NEUROLOGIC: No headache, seizures, numbness, tingling or weakness. PSYCHIATRIC: No depression, no loss of interest in normal activity or change in sleep pattern.     Exam:   BP 94/68    Pulse (!) 106   Ht '5\' 6"'$  (1.676 m)   Wt 190 lb (86.2 kg)   SpO2 96%   BMI 30.67 kg/m   Body mass index is 30.67 kg/m.  General appearance : Well developed well nourished female. No acute distress HEENT: Eyes: no retinal hemorrhage or exudates,  Neck supple, trachea midline, no carotid bruits, no thyroidmegaly Lungs: Clear to auscultation, no rhonchi or wheezes, or rib retractions  Heart: Regular rate and rhythm, no murmurs or gallops Breast:Examined in sitting and supine position were symmetrical in appearance, no palpable masses or tenderness,  no skin retraction, no nipple inversion, no nipple discharge, no skin discoloration, no axillary or supraclavicular lymphadenopathy Abdomen: no palpable masses or tenderness, no rebound or guarding Extremities: no edema or skin discoloration or tenderness  Pelvic: Vulva: Normal             Vagina: No gross lesions or discharge  Cervix/Uterus absent  Adnexa  Without masses or tenderness  Anus: Normal   Assessment/Plan:  61 y.o. female for annual exam   1. Well female exam with routine gynecological exam S/P Total Hysterectomy for Fibroids.  No pelvic pain.  No menopausal Sx.  Abstinent x many yrs.  Pap Neg 06/2019.  No h/o abnormal Pap.  Repeat Pap at 7 yrs.  Breasts normal. Mammo Neg 10/2021. Urine  normal.  BMs normal.  Colono 09/2020. BMI 30.67.  Walking. Father with Prostate Cancer.  Health Labs with Dr Birdie Riddle.  2. S/P total hysterectomy  3. Postmenopause  S/P Total Hysterectomy for Fibroids.  No pelvic pain.  Well on no HRT.  No menopausal Sx. Abstinent x many yrs.    Princess Bruins MD, 2:44 PM 08/06/2022

## 2022-08-13 ENCOUNTER — Encounter: Payer: BC Managed Care – PPO | Admitting: Family Medicine

## 2022-09-06 ENCOUNTER — Ambulatory Visit (INDEPENDENT_AMBULATORY_CARE_PROVIDER_SITE_OTHER): Payer: BC Managed Care – PPO | Admitting: Family Medicine

## 2022-09-06 ENCOUNTER — Encounter: Payer: Self-pay | Admitting: Family Medicine

## 2022-09-06 VITALS — BP 120/78 | HR 80 | Temp 99.0°F | Resp 17 | Ht 66.0 in | Wt 190.0 lb

## 2022-09-06 DIAGNOSIS — E559 Vitamin D deficiency, unspecified: Secondary | ICD-10-CM

## 2022-09-06 DIAGNOSIS — Z0000 Encounter for general adult medical examination without abnormal findings: Secondary | ICD-10-CM | POA: Diagnosis not present

## 2022-09-06 DIAGNOSIS — E669 Obesity, unspecified: Secondary | ICD-10-CM

## 2022-09-06 NOTE — Progress Notes (Signed)
   Subjective:    Patient ID: Hannah Ayala, female    DOB: 07-Sep-1961, 61 y.o.   MRN: [id number]  HPI CPE- UTD on Tdap, flu, mammo, colonoscopy.  No need for pap due to TAH.  No concerns today.  Patient Care Team    Relationship Specialty Notifications Start End  Midge Minium, MD PCP - General Family Medicine  10/19/17   Eldred Manges, MD (Inactive) Consulting Physician Obstetrics and Gynecology  10/19/17   Princess Bruins, MD Consulting Physician Obstetrics and Gynecology  08/06/22     Health Maintenance  Topic Date Due   Zoster Vaccines- Shingrix (1 of 2) 12/07/2022 (Originally 12/17/2012)   TETANUS/TDAP  09/07/2023 (Originally 08/22/2021)   MAMMOGRAM  11/24/2023   COLONOSCOPY (Pts 45-36yr Insurance coverage will need to be confirmed)  10/08/2027   INFLUENZA VACCINE  Completed   Hepatitis C Screening  Completed   HIV Screening  Completed   HPV VACCINES  Aged Out   COVID-19 Vaccine  Discontinued     Review of Systems Patient reports no vision/ hearing changes, adenopathy,fever, weight change,  persistant/recurrent hoarseness, chest pain, palpitations, edema, persistant/recurrent cough, hemoptysis, dyspnea (rest/exertional/paroxysmal nocturnal), gastrointestinal bleeding (melena, rectal bleeding), abdominal pain, significant heartburn, bowel changes, GU symptoms (dysuria, hematuria, incontinence), Gyn symptoms (abnormal  bleeding, pain),  syncope, focal weakness, memory loss, numbness & tingling, skin/hair/nail changes, abnormal bruising or bleeding, anxiety, or depression.   + dysphagia w/ dry food    Objective:   Physical Exam General Appearance:    Alert, cooperative, no distress, appears stated age  Head:    Normocephalic, without obvious abnormality, atraumatic  Eyes:    PERRL, conjunctiva/corneas clear, EOM's intact both eyes  Ears:    Normal TM's and external ear canals, both ears  Nose:   Nares normal, septum midline, mucosa normal, no drainage     or sinus tenderness  Throat:   Lips, mucosa, and tongue normal; teeth and gums normal  Neck:   Supple, symmetrical, trachea midline, no adenopathy;    Thyroid: no enlargement/tenderness/nodules  Back:     Symmetric, no curvature, ROM normal, no CVA tenderness  Lungs:     Clear to auscultation bilaterally, respirations unlabored  Chest Wall:    No tenderness or deformity   Heart:    Regular rate and rhythm, S1 and S2 normal, no murmur, rub   or gallop  Breast Exam:    Deferred to mammo  Abdomen:     Soft, non-tender, bowel sounds active all four quadrants,    no masses, no organomegaly  Genitalia:    Deferred   Rectal:    Extremities:   Extremities normal, atraumatic, no cyanosis or edema  Pulses:   2+ and symmetric all extremities  Skin:   Skin color, texture, turgor normal, no rashes or lesions  Lymph nodes:   Cervical, supraclavicular, and axillary nodes normal  Neurologic:   CNII-XII intact, normal strength, sensation and reflexes    throughout          Assessment & Plan:

## 2022-09-06 NOTE — Assessment & Plan Note (Signed)
Check labs and replete prn. 

## 2022-09-06 NOTE — Assessment & Plan Note (Signed)
Ongoing issue.  Weight is stable.  BMI 30.67  Encouraged low carb diet and regular exercise.  Check labs to risk stratify.  Will follow.

## 2022-09-06 NOTE — Patient Instructions (Addendum)
Follow up in 1 year or as needed We'll notify you of your lab results and make any changes if needed Continue to work on healthy diet and regular exercise- you look great!!! If you change your mind about the swallowing evaluation- let me know! Call with any questions or concerns Stay Safe!  Stay Healthy! Happy Holidays!!!

## 2022-09-06 NOTE — Assessment & Plan Note (Signed)
Pt's PE WNL w/ exception of BMI.  UTD on mammo, colonoscopy, immunizations.  Check labs.  Anticipatory guidance provided.

## 2022-09-07 LAB — BASIC METABOLIC PANEL
BUN: 10 mg/dL (ref 6–23)
CO2: 27 mEq/L (ref 19–32)
Calcium: 9.1 mg/dL (ref 8.4–10.5)
Chloride: 105 mEq/L (ref 96–112)
Creatinine, Ser: 0.73 mg/dL (ref 0.40–1.20)
GFR: 89.83 mL/min (ref >60.00)
Glucose, Bld: 103 mg/dL — ABNORMAL HIGH (ref 70–99)
Potassium: 4.1 mEq/L (ref 3.5–5.1)
Sodium: 137 mEq/L (ref 135–145)

## 2022-09-07 LAB — LIPID PANEL
Cholesterol: 165 mg/dL (ref 0–200)
HDL: 42.3 mg/dL (ref >39.00)
LDL Cholesterol: 103 mg/dL — ABNORMAL HIGH (ref 0–99)
NonHDL: 122.85
Total CHOL/HDL Ratio: 4
Triglycerides: 99 mg/dL (ref 0.0–149.0)
VLDL: 19.8 mg/dL (ref 0.0–40.0)

## 2022-09-07 LAB — CBC WITH DIFFERENTIAL/PLATELET
Basophils Absolute: 0.1 10*3/uL (ref 0.0–0.1)
Basophils Relative: 1.2 % (ref 0.0–3.0)
Eosinophils Absolute: 0.2 10*3/uL (ref 0.0–0.7)
Eosinophils Relative: 4.3 % (ref 0.0–5.0)
HCT: 39 % (ref 36.0–46.0)
Hemoglobin: 12.7 g/dL (ref 12.0–15.0)
Lymphocytes Relative: 31.3 % (ref 12.0–46.0)
Lymphs Abs: 1.7 10*3/uL (ref 0.7–4.0)
MCHC: 32.6 g/dL (ref 30.0–36.0)
MCV: 80.8 fl (ref 78.0–100.0)
Monocytes Absolute: 0.5 10*3/uL (ref 0.1–1.0)
Monocytes Relative: 9.9 % (ref 3.0–12.0)
Neutro Abs: 2.9 10*3/uL (ref 1.4–7.7)
Neutrophils Relative %: 53.3 % (ref 43.0–77.0)
Platelets: 304 10*3/uL (ref 150.0–400.0)
RBC: 4.82 Mil/uL (ref 3.87–5.11)
RDW: 15.1 % (ref 11.5–15.5)
WBC: 5.4 10*3/uL (ref 4.0–10.5)

## 2022-09-07 LAB — HEPATIC FUNCTION PANEL
ALT: 16 U/L (ref 0–35)
AST: 18 U/L (ref 0–37)
Albumin: 4.3 g/dL (ref 3.5–5.2)
Alkaline Phosphatase: 104 U/L (ref 39–117)
Bilirubin, Direct: 0.2 mg/dL (ref 0.0–0.3)
Total Bilirubin: 0.9 mg/dL (ref 0.2–1.2)
Total Protein: 6.9 g/dL (ref 6.0–8.3)

## 2022-09-07 LAB — TSH: TSH: 0.42 u[IU]/mL (ref 0.35–5.50)

## 2022-09-07 LAB — VITAMIN D 25 HYDROXY (VIT D DEFICIENCY, FRACTURES): VITD: 30.57 ng/mL (ref 30.00–100.00)

## 2022-10-27 ENCOUNTER — Other Ambulatory Visit: Payer: Self-pay | Admitting: Obstetrics & Gynecology

## 2022-10-27 DIAGNOSIS — Z1231 Encounter for screening mammogram for malignant neoplasm of breast: Secondary | ICD-10-CM

## 2022-12-15 ENCOUNTER — Ambulatory Visit
Admission: RE | Admit: 2022-12-15 | Discharge: 2022-12-15 | Disposition: A | Discharge status: Home / Self Care | Payer: BC Managed Care – PPO | Source: Ambulatory Visit | Attending: Obstetrics & Gynecology | Admitting: Obstetrics & Gynecology

## 2022-12-15 DIAGNOSIS — Z1231 Encounter for screening mammogram for malignant neoplasm of breast: Secondary | ICD-10-CM

## 2022-12-22 NOTE — Progress Notes (Unsigned)
    Hannah Ayala D.[place] Phone: [phone]   Assessment and Plan:     There are no diagnoses linked to this encounter.  ***   Pertinent previous records reviewed include ***   Follow Up: ***     Subjective:   I, Hannah Ayala, am serving as a Education administrator for Doctor Glennon Mac  Chief Complaint: right arm pain   HPI:   12/23/2022 Patient is a 62 year old female complaining of right arm pain. Patient states  Relevant Historical Information: ***  Additional pertinent review of systems negative.  No current outpatient medications on file.   Objective:     There were no vitals filed for this visit.    There is no height or weight on file to calculate BMI.    Physical Exam:    ***   Electronically signed by:  Hannah Ayala D.[facility] 12:10 PM 12/22/22

## 2022-12-23 ENCOUNTER — Ambulatory Visit: Payer: BC Managed Care – PPO | Admitting: Sports Medicine

## 2022-12-23 VITALS — BP 120/80 | HR 106 | Ht 66.0 in | Wt 190.0 lb

## 2022-12-23 DIAGNOSIS — M7711 Lateral epicondylitis, right elbow: Secondary | ICD-10-CM

## 2022-12-23 MED ORDER — MELOXICAM 15 MG PO TABS: 15.0000 mg | ORAL_TABLET | Freq: Every day | ORAL | 0 refills | Status: DC

## 2022-12-23 NOTE — Patient Instructions (Signed)
Good to see you - Start meloxicam 15 mg daily x2 weeks.  If still having pain after 2 weeks, complete 3rd-week of meloxicam. May use remaining meloxicam as needed once daily for pain control.  Do not to use additional NSAIDs while taking meloxicam.  May use Tylenol [phone] mg 2 to 3 times a day for breakthrough pain. Elbow HEP  Can purchase a wrist brace if you would like  As needed follow up if no improvement 3-4 week follow up

## 2023-01-21 ENCOUNTER — Other Ambulatory Visit: Payer: Self-pay | Admitting: Sports Medicine

## 2023-03-15 ENCOUNTER — Telehealth: Payer: Self-pay | Admitting: Family Medicine

## 2023-03-15 NOTE — Telephone Encounter (Signed)
Caller name: Kalpana Furmanski  On [place]?: Yes  Call back number: [phone] (mobile)  Provider they see: Sheliah Hatch, MD  Reason for call:  Tick possible been there a day or too. R shoulder it's flat and itchy???

## 2023-03-15 NOTE — Telephone Encounter (Signed)
Pt states he had tick on right shoulder possibly been there a day or two. States shoulder is flat and itchy. Would you like pt to come in and be seen?

## 2023-03-15 NOTE — Telephone Encounter (Signed)
I'm not sure what is meant by her 'shoulder is flat', but if it's bothering her or concerning to her, she should schedule an appt

## 2023-03-16 ENCOUNTER — Ambulatory Visit: Payer: BC Managed Care – PPO | Admitting: Family Medicine

## 2023-03-16 ENCOUNTER — Encounter: Payer: Self-pay | Admitting: Family Medicine

## 2023-03-16 VITALS — BP 128/78 | HR 88 | Temp 97.8°F | Resp 17 | Ht 66.0 in | Wt 190.5 lb

## 2023-03-16 DIAGNOSIS — S40261A Insect bite (nonvenomous) of right shoulder, initial encounter: Secondary | ICD-10-CM | POA: Diagnosis not present

## 2023-03-16 DIAGNOSIS — W57XXXA Bitten or stung by nonvenomous insect and other nonvenomous arthropods, initial encounter: Secondary | ICD-10-CM

## 2023-03-16 DIAGNOSIS — Z23 Encounter for immunization: Secondary | ICD-10-CM

## 2023-03-16 MED ORDER — MUPIROCIN 2 % EX OINT: 1.0000 | TOPICAL_OINTMENT | Freq: Two times a day (BID) | CUTANEOUS | 0 refills | Status: DC

## 2023-03-16 NOTE — Telephone Encounter (Signed)
Appointment was scheduled following a secondary call about this concern

## 2023-03-16 NOTE — Patient Instructions (Signed)
Follow up as needed or as scheduled This was a dog tick (thankfully) and shouldn't cause any problems Apply the Mupirocin twice daily to the bite site for 5-7 days IF the area gets red or swollen or painful, let me know Call with any questions or concerns Have a great summer!!!

## 2023-03-16 NOTE — Progress Notes (Signed)
   Subjective:    Patient ID: Hannah Ayala, female    DOB: 05-Mar-1961, 62 y.o.   MRN: [id number]  HPI Tick- R shoulder.  Pt isn't sure how long it has been there, 'maybe a few days, maybe a week'.  Some itching, faint redness.  No pain.  Due for Tdap   Review of Systems For ROS see HPI     Objective:   Physical Exam Vitals reviewed.  Constitutional:      General: She is not in acute distress.    Appearance: Normal appearance. She is not ill-appearing.  HENT:     Head: Normocephalic and atraumatic.  Skin:    General: Skin is warm and dry.     Comments: Engorged tick embedded in skin over R AC joint.  Mild redness at bite site w/o induration, fluctuance.  Area was prepped w/ alcohol pad, tick was removed intact w/ forceps.  Bite site is clean, no foreign body remaining.  Pt tolerated w/o difficulty.  No bleeding at site.  Neurological:     General: No focal deficit present.     Mental Status: She is alert and oriented to person, place, and time.  Psychiatric:        Mood and Affect: Mood normal.        Behavior: Behavior normal.        Thought Content: Thought content normal.           Assessment & Plan:  Tick bite R shoulder- new.  Pt presented w/ engorged dog tick embedded in skin.  Consented to removal and tolerated w/o difficulty.  Tick removed intact.  Mupirocin ointment provided to use for next few days.  Tdap updated.  Reviewed that dog ticks do not carry Lyme.  Reviewed supportive care and red flags that should prompt return.  Pt expressed understanding and is in agreement w/ plan.

## 2023-08-09 ENCOUNTER — Ambulatory Visit: Payer: BC Managed Care – PPO | Admitting: Obstetrics & Gynecology

## 2023-09-07 ENCOUNTER — Telehealth: Payer: Self-pay | Admitting: Family Medicine

## 2023-09-07 NOTE — Telephone Encounter (Signed)
Patient Name First: Hannah Last: Ayala Gender: Female DOB: 1960-12-20 Age: 62 Y 8 M 20 D Return Phone Number: [phone] (Primary) Address: City/ State/ Zip: [place]  [address] Client [facility] [place] Night - C Client Site [facility] - Night Provider Lezlie Octave- MD Contact Type Call Who Is Calling Patient / Member / Family / Caregiver Call Type Triage / Clinical Caller Name Manilla Lueras Relationship To Patient Self Return Phone Number [phone] (Primary) Chief Complaint Spider Bite Reason for Call Request to Schedule Office Appointment Initial Comment Caller states she would like an appt. She has a spider bite for a week that is is getting worse and is very itchy. Additional Comment Office hours provided. Translation No Nurse Assessment Nurse: Wiser, RN, Heidi Date/Time (Eastern Time): 09/06/2023 4:26:44 PM Confirm and document reason for call. If symptomatic, describe symptoms. ---Caller states last week she noticed a very itchy spot on the front of her left ankle . Area became swollen on Friday, but currently swelling has decreased. Small pus filled blister present but has drained and has a scab. Area is very itchy. Caller states she has also has a spot on her right second toe. Caller states no insect bite obviously present. Does the patient have any new or worsening symptoms? ---Yes Will a triage be completed? ---Yes Related visit to physician within the last 2 weeks? ---No Does the PT have any chronic conditions? (i.e. diabetes, asthma, this includes High risk factors for pregnancy, etc.) ---No Is this a behavioral health or substance abuse call? ---No Guidelines Guideline Title Affirmed Question Affirmed Notes Nurse Date/Time Lamount Cohen Time) Insect Bite [1] SEVERE local itching (i.e., interferes Wiser, RN, Heidi 09/06/2023 4:33:39 PM PLEASE NOTE: All timestamps contained within  this report are represented as [place] Standard Time. CONFIDENTIALTY NOTICE: This fax transmission is intended only for the addressee. It contains information that is legally privileged, confidential or otherwise protected from use or disclosure. If you are not the intended recipient, you are strictly prohibited from reviewing, disclosing, copying using or disseminating any of this information or taking any action in reliance on or regarding this information. If you have received this fax in error, please notify us immediately by telephone so that we can arrange for its return to [place]. Phone: [phone], Toll-Free: [phone], Fax: [phone] Page: 2 of 2 Call Id: [id number] Guidelines Guideline Title Affirmed Question Affirmed Notes Nurse Date/Time Lamount Cohen Time) with work, school, sleep) AND [2] not improved after 24 hours of hydrocortisone cream Disp. Time Lamount Cohen Time) Disposition Final User 09/06/2023 4:39:38 PM SEE PCP WITHIN 3 DAYS Yes Wiser, RN, Heidi Final Disposition 09/06/2023 4:39:38 PM SEE PCP WITHIN 3 DAYS Yes Wiser, RN, Heidi  Scheduled appt tomorrow 09/08/2023

## 2023-09-08 ENCOUNTER — Ambulatory Visit: Payer: BC Managed Care – PPO | Admitting: Family Medicine

## 2023-09-08 ENCOUNTER — Encounter: Payer: Self-pay | Admitting: Family Medicine

## 2023-09-08 VITALS — BP 118/70 | HR 79 | Temp 97.7°F | Ht 66.0 in | Wt 184.0 lb

## 2023-09-08 DIAGNOSIS — W57XXXA Bitten or stung by nonvenomous insect and other nonvenomous arthropods, initial encounter: Secondary | ICD-10-CM | POA: Diagnosis not present

## 2023-09-08 DIAGNOSIS — F329 Major depressive disorder, single episode, unspecified: Secondary | ICD-10-CM

## 2023-09-08 DIAGNOSIS — F32A Depression, unspecified: Secondary | ICD-10-CM | POA: Insufficient documentation

## 2023-09-08 DIAGNOSIS — S90862A Insect bite (nonvenomous), left foot, initial encounter: Secondary | ICD-10-CM | POA: Diagnosis not present

## 2023-09-08 DIAGNOSIS — Z01419 Encounter for gynecological examination (general) (routine) without abnormal findings: Secondary | ICD-10-CM

## 2023-09-08 MED ORDER — TRIAMCINOLONE ACETONIDE 0.1 % EX OINT: 1.0000 | TOPICAL_OINTMENT | Freq: Two times a day (BID) | CUTANEOUS | 1 refills | Status: DC

## 2023-09-08 NOTE — Progress Notes (Signed)
   Subjective:    Patient ID: Hannah Ayala, female    DOB: 25-Sep-1961, 62 y.o.   MRN: [id number]  HPI 'i think I was bit by something'- top of L foot near ankle, R 2nd toe.  First noticed ~1 week ago.  Extremely itchy.  Has not been anywhere new or different.  No hotel stays recently.  Needs new GYN referral- current provider left the area  Depression- pt lost her dad in July.  Mom does not approve of the man she is currently dating- who she was engaged to 40 yrs ago but called it off.  She has been working w/ [employer] but felt like she was 'the entertainment'.  Would like a new referral.     Review of Systems For ROS see HPI     Objective:   Physical Exam Vitals reviewed.  Constitutional:      General: She is not in acute distress.    Appearance: Normal appearance. She is not ill-appearing.  Skin:    General: Skin is warm and dry.     Findings: Lesion (excoriated discrete circular lesions on dorsum of L foot near ankle (x3) and 1 on R 2nd toe w/o pus, drainage, or induration) present.  Neurological:     General: No focal deficit present.     Mental Status: She is alert and oriented to person, place, and time.  Psychiatric:     Comments: Tearful when talking about her father           Assessment & Plan:  Insect bites L foot- new.  No evidence of infection but she has excoriated areas.  Will start topical triamcinolone ointment to help w/ itching.  Reviewed signs of secondary infection.  She denies lesions elsewhere on her body- which would make scabies less likely.  She is to let me know if areas spread or worsen.  Pt expressed understanding and is in agreement w/ plan.

## 2023-09-08 NOTE — Assessment & Plan Note (Signed)
New.  Pt lost her father in July.  # provided for Hospice grief counseling.  She is also struggling w/ her relationship with her mom over a man that has re-entered her life from 40 yrs ago.  She is not interested in medication but she is interested in talking to someone.  Referral for counseling placed.  Pt expressed understanding and is in agreement w/ plan.

## 2023-09-08 NOTE — Patient Instructions (Signed)
Follow up as needed or as scheduled APPLY the Triamcinolone ointment twice daily to help w/ itching When possible, allow the area to breathe Call with any questions or concerns Stay Safe!  Stay Healthy! Happy Holidays!!

## 2023-09-09 ENCOUNTER — Encounter: Payer: BC Managed Care – PPO | Admitting: Family Medicine

## 2023-09-15 ENCOUNTER — Encounter: Payer: Self-pay | Admitting: Family Medicine

## 2023-09-15 ENCOUNTER — Ambulatory Visit: Payer: BC Managed Care – PPO | Admitting: Family Medicine

## 2023-09-15 ENCOUNTER — Telehealth: Payer: Self-pay | Admitting: Family Medicine

## 2023-09-15 VITALS — BP 118/64 | HR 89 | Temp 97.8°F | Ht 66.0 in | Wt 183.4 lb

## 2023-09-15 DIAGNOSIS — B86 Scabies: Secondary | ICD-10-CM

## 2023-09-15 MED ORDER — PERMETHRIN 5 % EX CREA: 1.0000 | TOPICAL_CREAM | Freq: Once | CUTANEOUS | 1 refills | Status: DC

## 2023-09-15 MED ORDER — HYDROXYZINE PAMOATE 25 MG PO CAPS: 25.0000 mg | ORAL_CAPSULE | Freq: Three times a day (TID) | ORAL | 0 refills | Status: DC | PRN

## 2023-09-15 NOTE — Patient Instructions (Addendum)
Follow up as needed or as scheduled APPLY the permethrin cream from hair/jaw line down and sleep in it overnight When you wake up, strip the bed and wash the towels Bathe to remove the cream If needed, reapply the cream after 7 days The itching may get worse before it gets better as the mites die and are expelled from the skin Call with any questions or concerns Hang in there!! Happy Thanksgiving!!!

## 2023-09-15 NOTE — Progress Notes (Signed)
   Subjective:    Patient ID: Hannah Ayala, female    DOB: [birth year], 62 y.o.   MRN: [id number]  HPI Rash- pt reports itching has progressed and now has areas between fingers, all over arms, legs, back.  Has not been anywhere new or different, but does have a dog that sleeps in her bed   Review of Systems For ROS see HPI     Objective:   Physical Exam Vitals reviewed.  Constitutional:      General: She is not in acute distress.    Appearance: Normal appearance. She is not ill-appearing.  HENT:     Head: Normocephalic and atraumatic.  Skin:    General: Skin is warm and dry.     Comments: Excoriations of arms, ankles, waistline  Neurological:     General: No focal deficit present.     Mental Status: She is alert and oriented to person, place, and time.  Psychiatric:        Mood and Affect: Mood normal.        Behavior: Behavior normal.        Thought Content: Thought content normal.           Assessment & Plan:  Scabies- new.  Pt has excoriations diffusely.  Having sxs in finger webbing and feet.  Suspect she got this from her dog that sleeps in bed w/ her.  Tx w/ Elimite.  Instructions given on need to wash bedding, towels, clothes, and sit on hard back furniture for a few days to prevent re-infxn.  Also encouraged her to take dog to be treated.  Pt expressed understanding and is in agreement w/ plan.

## 2023-09-15 NOTE — Telephone Encounter (Signed)
Caller name: Jaylen Appler  On DPR?: Yes  Call back number: [phone] (home)  Provider they see: Sheliah Hatch, MD  Reason for call:   Pt is stating that she's leaving vet and dog doesn't have Scabies. So she would like to be tested for Shingles. She feels to head it off early if is best.

## 2023-09-16 NOTE — Telephone Encounter (Signed)
Shingles is typically painful and not itchy.  It is also confined to one area of the body, does not cross the midline, and is not spread all over.  Clinically, this is not shingles

## 2023-09-16 NOTE — Telephone Encounter (Signed)
She uses the cream 1 time overnight- from jaw line/hairline down to and including toes.  Cover the whole body.  If symptoms continue after 7 days, she is to repeat the cream at that time.

## 2023-09-16 NOTE — Telephone Encounter (Signed)
Pt is asking how many days does she apply the cream and does she apply from to toes.

## 2023-09-16 NOTE — Telephone Encounter (Signed)
Pt has been notified.

## 2023-10-07 ENCOUNTER — Ambulatory Visit: Payer: BC Managed Care – PPO | Admitting: Family Medicine

## 2023-10-07 ENCOUNTER — Encounter: Payer: Self-pay | Admitting: Family Medicine

## 2023-10-07 VITALS — BP 128/74 | HR 94 | Temp 98.4°F | Ht 66.0 in | Wt 185.2 lb

## 2023-10-07 DIAGNOSIS — Z87448 Personal history of other diseases of urinary system: Secondary | ICD-10-CM | POA: Diagnosis not present

## 2023-10-07 DIAGNOSIS — Z0000 Encounter for general adult medical examination without abnormal findings: Secondary | ICD-10-CM

## 2023-10-07 DIAGNOSIS — Z136 Encounter for screening for cardiovascular disorders: Secondary | ICD-10-CM

## 2023-10-07 DIAGNOSIS — E663 Overweight: Secondary | ICD-10-CM | POA: Diagnosis not present

## 2023-10-07 DIAGNOSIS — E559 Vitamin D deficiency, unspecified: Secondary | ICD-10-CM | POA: Diagnosis not present

## 2023-10-07 LAB — HEPATIC FUNCTION PANEL
ALT: 17 U/L (ref 0–35)
AST: 15 U/L (ref 0–37)
Albumin: 4.5 g/dL (ref 3.5–5.2)
Alkaline Phosphatase: 118 U/L — ABNORMAL HIGH (ref 39–117)
Bilirubin, Direct: 0.1 mg/dL (ref 0.0–0.3)
Total Bilirubin: 0.6 mg/dL (ref 0.2–1.2)
Total Protein: 7.1 g/dL (ref 6.0–8.3)

## 2023-10-07 LAB — CBC WITH DIFFERENTIAL/PLATELET
Basophils Absolute: 0 10*3/uL (ref 0.0–0.1)
Basophils Relative: 0.7 % (ref 0.0–3.0)
Eosinophils Absolute: 0.1 10*3/uL (ref 0.0–0.7)
Eosinophils Relative: 2.8 % (ref 0.0–5.0)
HCT: 41.2 % (ref 36.0–46.0)
Hemoglobin: 13.8 g/dL (ref 12.0–15.0)
Lymphocytes Relative: 31.8 % (ref 12.0–46.0)
Lymphs Abs: 1.6 10*3/uL (ref 0.7–4.0)
MCHC: 33.6 g/dL (ref 30.0–36.0)
MCV: 82.7 fL (ref 78.0–100.0)
Monocytes Absolute: 0.5 10*3/uL (ref 0.1–1.0)
Monocytes Relative: 9.9 % (ref 3.0–12.0)
Neutro Abs: 2.7 10*3/uL (ref 1.4–7.7)
Neutrophils Relative %: 54.8 % (ref 43.0–77.0)
Platelets: 273 10*3/uL (ref 150.0–400.0)
RBC: 4.98 Mil/uL (ref 3.87–5.11)
RDW: 14.7 % (ref 11.5–15.5)
WBC: 5 10*3/uL (ref 4.0–10.5)

## 2023-10-07 LAB — POCT URINALYSIS DIPSTICK
Bilirubin, UA: NEGATIVE
Glucose, UA: NEGATIVE
Ketones, UA: NEGATIVE
Nitrite, UA: NEGATIVE
Protein, UA: NEGATIVE
Spec Grav, UA: 1.025 (ref 1.010–1.025)
Urobilinogen, UA: 0.2 U/dL
pH, UA: 6 (ref 5.0–8.0)

## 2023-10-07 LAB — VITAMIN D 25 HYDROXY (VIT D DEFICIENCY, FRACTURES): VITD: 13.94 ng/mL — ABNORMAL LOW (ref 30.00–100.00)

## 2023-10-07 LAB — LIPID PANEL
Cholesterol: 186 mg/dL (ref 0–200)
HDL: 43.7 mg/dL (ref >39.00)
LDL Cholesterol: 112 mg/dL — ABNORMAL HIGH (ref 0–99)
NonHDL: 142.5
Total CHOL/HDL Ratio: 4
Triglycerides: 154 mg/dL — ABNORMAL HIGH (ref 0.0–149.0)
VLDL: 30.8 mg/dL (ref 0.0–40.0)

## 2023-10-07 LAB — BASIC METABOLIC PANEL
BUN: 8 mg/dL (ref 6–23)
CO2: 27 meq/L (ref 19–32)
Calcium: 9.5 mg/dL (ref 8.4–10.5)
Chloride: 105 meq/L (ref 96–112)
Creatinine, Ser: 0.71 mg/dL (ref 0.40–1.20)
GFR: 92.17 mL/min (ref >60.00)
Glucose, Bld: 118 mg/dL — ABNORMAL HIGH (ref 70–99)
Potassium: 4.3 meq/L (ref 3.5–5.1)
Sodium: 140 meq/L (ref 135–145)

## 2023-10-07 LAB — TSH: TSH: 0.63 u[IU]/mL (ref 0.35–5.50)

## 2023-10-07 NOTE — Assessment & Plan Note (Signed)
Encouraged healthy diet and regular exercise- check labs to risk stratify.

## 2023-10-07 NOTE — Assessment & Plan Note (Signed)
Check labs and replete prn. 

## 2023-10-07 NOTE — Assessment & Plan Note (Signed)
Pt's PE WNL w/ exception of BMI.  UTD on mammo, colonoscopy, Tdap, flu.  Check labs.  Anticipatory guidance provided.

## 2023-10-07 NOTE — Patient Instructions (Signed)
Follow up in 1 year or as needed We'll notify you of your lab results and make any changes if needed Continue to work on healthy diet and regular exercise- you're doing great!!! Call with any questions or concerns Stay Safe!  Stay Healthy!! Happy Holidays!!! 

## 2023-10-07 NOTE — Assessment & Plan Note (Signed)
Check UA 

## 2023-10-07 NOTE — Progress Notes (Signed)
   Subjective:    Patient ID: Hannah Ayala, female    DOB: November 17, 1960, 62 y.o.   MRN: [id number]  HPI CPE- UTD on mammo, colonoscopy, Tdap, flu.  No need for pap due to hysterectomy  Patient Care Team    Relationship Specialty Notifications Start End  Sheliah Hatch, MD PCP - General Family Medicine  10/19/17   Hal Morales, MD (Inactive) Consulting Physician Obstetrics and Gynecology  10/19/17   Genia Del, MD Consulting Physician Obstetrics and Gynecology  08/06/22      Health Maintenance  Topic Date Due   Zoster Vaccines- Shingrix (1 of 2) Never done   INFLUENZA VACCINE  05/26/2023   COVID-19 Vaccine (4 - 2024-25 season) 06/26/2023   MAMMOGRAM  12/15/2024   Colonoscopy  10/08/2027   DTaP/Tdap/Td (3 - Td or Tdap) 03/15/2033   Hepatitis C Screening  Completed   HIV Screening  Completed   HPV VACCINES  Aged Out      Review of Systems Patient reports no vision/ hearing changes, adenopathy,fever, weight change,  persistant/recurrent hoarseness , swallowing issues, chest pain, palpitations, edema, persistant/recurrent cough, hemoptysis, dyspnea (rest/exertional/paroxysmal nocturnal), gastrointestinal bleeding (melena, rectal bleeding), abdominal pain, significant heartburn, bowel changes, GU symptoms (dysuria, hematuria, incontinence), Gyn symptoms (abnormal  bleeding, pain),  syncope, focal weakness, memory loss, numbness & tingling, skin/hair/nail changes, abnormal bruising or bleeding, anxiety, or depression.     Objective:   Physical Exam General Appearance:    Alert, cooperative, no distress, appears stated age  Head:    Normocephalic, without obvious abnormality, atraumatic  Eyes:    PERRL, conjunctiva/corneas clear, EOM's intact both eyes  Ears:    Normal TM's and external ear canals, both ears  Nose:   Nares normal, septum midline, mucosa normal, no drainage    or sinus tenderness  Throat:   Lips, mucosa, and tongue normal; teeth and gums  normal  Neck:   Supple, symmetrical, trachea midline, no adenopathy;    Thyroid: no enlargement/tenderness/nodules  Back:     Symmetric, no curvature, ROM normal, no CVA tenderness  Lungs:     Clear to auscultation bilaterally, respirations unlabored  Chest Wall:    No tenderness or deformity   Heart:    Regular rate and rhythm, S1 and S2 normal, no murmur, rub   or gallop  Breast Exam:    Deferred to GYN  Abdomen:     Soft, non-tender, bowel sounds active all four quadrants,    no masses, no organomegaly  Genitalia:    Deferred to GYN  Rectal:    Extremities:   Extremities normal, atraumatic, no cyanosis or edema  Pulses:   2+ and symmetric all extremities  Skin:   Skin color, texture, turgor normal, no rashes or lesions  Lymph nodes:   Cervical, supraclavicular, and axillary nodes normal  Neurologic:   CNII-XII intact, normal strength, sensation and reflexes    throughout          Assessment & Plan:

## 2023-10-08 LAB — URINE CULTURE
MICRO NUMBER:: 15849593
Result:: NO GROWTH
SPECIMEN QUALITY:: ADEQUATE

## 2023-10-11 MED ORDER — VITAMIN D (ERGOCALCIFEROL) 1.25 MG (50000 UNIT) PO CAPS: 50000.0000 [IU] | ORAL_CAPSULE | ORAL | 0 refills | Status: DC

## 2023-10-11 NOTE — Addendum Note (Signed)
Addended by: Eldred Manges on: 10/11/2023 10:09 AM   Modules accepted: Orders

## 2023-10-11 NOTE — Addendum Note (Signed)
Addended by: Sheliah Hatch on: 10/11/2023 10:35 AM   Modules accepted: Orders

## 2023-11-03 ENCOUNTER — Encounter: Payer: Self-pay | Admitting: Family Medicine

## 2023-11-03 ENCOUNTER — Ambulatory Visit (INDEPENDENT_AMBULATORY_CARE_PROVIDER_SITE_OTHER): Payer: 59 | Admitting: Family Medicine

## 2023-11-03 VITALS — BP 124/74 | HR 99 | Temp 97.8°F | Ht 66.0 in | Wt 187.2 lb

## 2023-11-03 DIAGNOSIS — R21 Rash and other nonspecific skin eruption: Secondary | ICD-10-CM | POA: Diagnosis not present

## 2023-11-03 DIAGNOSIS — B86 Scabies: Secondary | ICD-10-CM | POA: Diagnosis not present

## 2023-11-03 MED ORDER — BETAMETHASONE DIPROPIONATE 0.05 % EX CREA: TOPICAL_CREAM | Freq: Two times a day (BID) | CUTANEOUS | 1 refills | Status: DC

## 2023-11-03 MED ORDER — PERMETHRIN 5 % EX CREA: 1.0000 | TOPICAL_CREAM | Freq: Once | CUTANEOUS | 1 refills | Status: DC

## 2023-11-03 NOTE — Progress Notes (Signed)
   Subjective:    Patient ID: Hannah Ayala, female    DOB: Aug 23, 1961, 63 y.o.   MRN: [id number]  HPI Rash- pt was dx'd w/ scabies on 11/21.  Doesn't feel that things ever completely resolved at that time.  After last visit she took her dog to vet, washed all linens.  Continues to have 'red, itchy dots'  Now has patch on R knee that is very red, confluent, dry and itchy.     Review of Systems For ROS see HPI     Objective:   Physical Exam Vitals reviewed.  Constitutional:      General: She is not in acute distress.    Appearance: Normal appearance. She is not ill-appearing.  HENT:     Head: Normocephalic and atraumatic.  Skin:    General: Skin is warm and dry.     Findings: Erythema (over R medial distal thigh- skin is dry, rough, itchy w/ satellite areas) and lesion (excoriated papules scattered on arms, hands, chest, lower legs- very itchy) present.  Neurological:     General: No focal deficit present.     Mental Status: She is alert and oriented to person, place, and time.  Psychiatric:        Mood and Affect: Mood normal.        Behavior: Behavior normal.        Thought Content: Thought content normal.           Assessment & Plan:  Scabies- recurrent.  Pt was treated last month 6 weeks ago and continues to have scattered burrows and itching.  Retreat w/ Permethrin  and reviewed need to treat sheets/towels/clothes and stay off upholstery.  Pt expressed understanding and is in agreement w/ plan.   Rash- new.  R medial distal thigh.  Area is erythematous and confluent w/ satellite lesions, dry, almost eczematous in appearance.  Start high potency topical steroid.  Reviewed supportive care and red flags that should prompt return.  Pt expressed understanding and is in agreement w/ plan.

## 2023-11-03 NOTE — Patient Instructions (Signed)
 Follow up as needed or as scheduled APPLY the Permethrin  Cream from the neck down- all over- and sleep in it overnight.  Repeat in 5 days Wake up, shower, use a clean towel.  Make sure you wash all towels and sheets USE the Betamethasone  twice daily on the patch on your knee Try NOT to scratch! Call with any questions or concerns Hang in there!

## 2023-11-07 ENCOUNTER — Ambulatory Visit: Payer: BC Managed Care – PPO | Admitting: Dietician

## 2023-11-14 ENCOUNTER — Encounter: Payer: Self-pay | Admitting: Dietician

## 2023-11-14 ENCOUNTER — Encounter: Payer: 59 | Attending: Family Medicine | Admitting: Dietician

## 2023-11-14 VITALS — Ht 66.0 in | Wt 185.9 lb

## 2023-11-14 DIAGNOSIS — E663 Overweight: Secondary | ICD-10-CM | POA: Insufficient documentation

## 2023-11-14 NOTE — Progress Notes (Signed)
Medical Nutrition Therapy  Appointment Start time:  [phone]  Appointment End time:  1014  Primary concerns today: pt states she wants to lose some weight  Referral diagnosis: E66.3 (Overweight) Preferred learning style: no preference indicated (auditory, visual, hands on, no preference indicated) Learning readiness: preparation (not ready, contemplating, ready, change in progress)  NUTRITION ASSESSMENT  Anthropometrics Weight: 185.9 lb Height: 66 in   Clinical Medical Hx: n/a Medications: vit D2 (weekly) and OTC vit D3 (daily) Labs: LDL 112; triglycerides 154.0; glucose 118 (pt states she was not fasting) Notable Signs/Symptoms: none noted  Lifestyle & Dietary Hx  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 Pt states she will sleep when home for work, and wake again at 11 pm to take the dog out, and will be up for several hours. Pt states she wants to lose some weight. Pt states she likes sweets and eats foods that are convenient.   Estimated daily fluid intake: ? (pt states she does not know) Supplements: Vit D Sleep: good, can fall asleep if still or after eating Stress / self-care: not stressed Current average weekly physical activity: walk the dog a short distance. Pt states she does not like to work out, but states she enjoys walking, stating it is easier to sit on the couch and watch TV  24-Hr Dietary Recall First Meal: skip or scrambled egg with veggies and ham or [place] bacon Snack: vending machine (chips, rice crispy treats) Second Meal: fast food Snack: something sweet Third Meal: [employer] or other take out Snack: if didn't eat dinner Beverages: sweet tea (iced or hot), soda, water  NUTRITION DIAGNOSIS  NB-1.4 Self-monitoring deficit As related to increase in sweetened beverages, sweet snacks, and fast food.  As evidenced by food recall and food patterns.  NUTRITION INTERVENTION  Nutrition education (E-1) on the following topics:  Fruits & Vegetables:  Aim to fill half your plate with a variety of fruits and vegetables. They are rich in vitamins, minerals, and fiber, and can help reduce the risk of chronic diseases. Choose a colorful assortment of fruits and vegetables to ensure you get a wide range of nutrients. Grains and Starches: Make at least half of your grain choices whole grains, such as Devyn Griffing rice, whole wheat bread, and oats. Whole grains provide fiber, which aids in digestion and healthy cholesterol levels. Aim for whole forms of starchy vegetables such as potatoes, sweet potatoes, beans, peas, and corn, which are fiber rich and provide many vitamins and minerals.  Protein: Incorporate lean sources of protein, such as poultry, fish, beans, nuts, and seeds, into your meals. Protein is essential for building and repairing tissues, staying full, balancing blood sugar, as well as supporting immune function. Dairy: Include low-fat or fat-free dairy products like milk, yogurt, and cheese in your diet. Dairy foods are excellent sources of calcium and vitamin D, which are crucial for bone health.  Physical Activity: Aim for 150 minutes of physical activity weekly. Regular physical activity promotes overall health-including helping to reduce risk for heart disease and diabetes, promoting mental health, and helping [place] sleep better.  Why you need complex carbohydrates: Whole grains and other complex carbohydrates are required to have a healthy diet. Whole grains provide fiber which can help with blood glucose levels and help keep you satiated. Fruits and starchy vegetables provide essential vitamins and minerals required for immune function, eyesight support, brain support, bone density, wound healing and many other functions within the body. According to the current  evidenced based 2020-2025 Dietary Guidelines for Americans, complex carbohydrates are part of a healthy eating pattern which is associated with a decreased risk for type 2 diabetes, cancers, and  cardiovascular disease.   Handouts Provided Include  Health Benefits of Physical Activity Meal Ideas Types of Fat (saturated vs unsaturated) USDA MyPlate Healthy Snacking USDA MyPlate Healthy Eating for Adults USDA MyPlate Make Better Beverage Choices  Learning Style & Readiness for Change Teaching method utilized: Visual & Auditory  Demonstrated degree of understanding via: Teach Back  Barriers to learning/adherence to lifestyle change: nothing identified  Goals Established by Pt Increase water intake; aim for 64 oz water (mostly plain water) Increase physical activity, 5 days per week for 30 minutes each day.  MONITORING & EVALUATION Dietary intake, weekly physical activity, and weight.  Next Steps  Patient is to return in 2 months for follow-up.

## 2024-01-02 ENCOUNTER — Other Ambulatory Visit: Payer: Self-pay | Admitting: Family Medicine

## 2024-01-10 ENCOUNTER — Ambulatory Visit: Payer: 59 | Admitting: Dietician

## 2024-01-30 ENCOUNTER — Ambulatory Visit: Payer: Self-pay

## 2024-01-30 NOTE — Telephone Encounter (Signed)
 Pt should uses cool compresses to help w/ swelling and itching, can take Claritin or Zyrtec to help w/ any allergy component.

## 2024-01-30 NOTE — Telephone Encounter (Signed)
 Called patient to relay Dr.Tabori's recommendations, patient verbalized understanding. I did recommend keeping future appointment.

## 2024-01-30 NOTE — Telephone Encounter (Signed)
 Chief Complaint: Facial swelling Symptoms: dry patches, burning, itching Frequency: x 3 days Pertinent Negatives: Patient denies fever, SOB Disposition: [] ED /[] Urgent Care (no appt availability in office) / [] Appointment(In office/virtual)/ []  [facility]/ [] Home Care/ [] Refused Recommended Disposition /[] [place] [facility]/ []  Follow-up with PCP Additional Notes: Pt reports she has been experiencing puffiness in her eyes, dry patches, itching and burning. Pt notes she had a recent facial and has tried multiple OTC products with little to no relief.  Denies fever, SOB, leg swelling. OV scheduled for 04/09. This RN educated pt on home care, new-worsening symptoms, when to call back/seek emergent care. Pt verbalized understanding and agrees to plan.     Copied from CRM [id number]. Topic: Clinical - Red Word Triage >> Jan 30, 2024 11:49 AM Drema Balzarine wrote: Red Word that prompted transfer to Nurse Triage: Patient says her face feels like it on fire, she has dry patches on her face, eyes are puffy and face is extremely itchy for the past 2 days Reason for Disposition  [1] Mild face swelling (puffiness) AND [2] persists > 3 days  Answer Assessment - Initial Assessment Questions 1. ONSET: "When did the swelling start?" (e.g., minutes, hours, days)     X 3 days 2. LOCATION: "What part of the face is swollen?"     Eyes 3. SEVERITY: "How swollen is it?"     mild 4. ITCHING: "Is there any itching?" If Yes, ask: "How much?"   (Scale 1-10; mild, moderate or severe)     Yes  6. FEVER: "Do you have a fever?" If Yes, ask: "What is it, how was it measured, and when did it start?"      None 7. CAUSE: "What do you think is causing the face swelling?"     Unknown 9. OTHER SYMPTOMS: "Do you have any other symptoms?" (e.g., toothache, leg swelling)     Dry patches, burning, itching  Protocols used: Face Swelling-A-AH

## 2024-02-01 ENCOUNTER — Ambulatory Visit: Admitting: Family Medicine

## 2024-03-20 ENCOUNTER — Ambulatory Visit: Payer: Self-pay

## 2024-03-20 NOTE — Telephone Encounter (Signed)
 Patient going to Urgent care

## 2024-03-20 NOTE — Telephone Encounter (Signed)
 Copied from CRM [id number]. Topic: Clinical - Red Word Triage >> Mar 20, 2024  2:11 PM [place] E wrote: [facility] that prompted transfer to Nurse Triage: Patient called in stating that her whole face is itchy and the skin under her eyes is a little rough and swollen. Been going on for  a week.    Chief Complaint: Facial itching, eyes are puffy in the mornings. Symptoms: above Frequency: last week Pertinent Negatives: Patient denies  Disposition: [] ED /[] Urgent Care (no appt availability in office) / [x] Appointment(In office/virtual)/ []  [facility]/ [] Home Care/ [] Refused Recommended Disposition /[] [facility]/ []  Follow-up with PCP Additional Notes: agrees with appointment.  Reason for Disposition  [1] MILD pain (e.g., does not interfere with normal activities) AND [2] constant AND [3] present > 24 hours  (Exception: Symptom is chronic.)  Answer Assessment - Initial Assessment Questions 1. ONSET: "When did the pain start?" (e.g., minutes, hours, days)     1 week 2. ONSET: "Does the pain come and go, or has it been constant since it started?" (e.g., constant, intermittent, fleeting)     Comes and goes 3. SEVERITY: "How bad is the pain?"   (Scale 1-10; mild, moderate or severe)   - MILD (1-3): doesn't interfere with normal activities    - MODERATE (4-7): interferes with normal activities or awakens from sleep    - SEVERE (8-10): excruciating pain, unable to do any normal activities      mild 4. LOCATION: "Where does it hurt?"      Under eyes 5. RASH: "Is there any redness, rash, or swelling of the face?"     Puffiness in the morning, dark under eyes 6. FEVER: "Do you have a fever?" If Yes, ask: "What is it, how was it measured, and when did it start?"      no 7. OTHER SYMPTOMS: "Do you have any other symptoms?" (e.g., fever, toothache, nasal discharge, nasal congestion, clicking sensation in jaw joint)     no 8. PREGNANCY: "Is there any chance you are pregnant?"  "When was your last menstrual period?"     no  Protocols used: Face Pain-A-AH

## 2024-03-22 ENCOUNTER — Encounter: Payer: Self-pay | Admitting: Family Medicine

## 2024-03-22 ENCOUNTER — Ambulatory Visit: Payer: Self-pay | Admitting: Family Medicine

## 2024-03-22 ENCOUNTER — Ambulatory Visit (INDEPENDENT_AMBULATORY_CARE_PROVIDER_SITE_OTHER): Admitting: Family Medicine

## 2024-03-22 VITALS — BP 122/82 | HR 87 | Temp 97.8°F | Ht 66.0 in | Wt 182.0 lb

## 2024-03-22 DIAGNOSIS — L819 Disorder of pigmentation, unspecified: Secondary | ICD-10-CM

## 2024-03-22 LAB — HEMOGLOBIN A1C: Hgb A1c MFr Bld: 6.5 % (ref 4.6–6.5)

## 2024-03-22 NOTE — Progress Notes (Signed)
   Subjective:    Patient ID: Hannah Ayala, female    DOB: 1961-01-06, 63 y.o.   MRN: [id number]  HPI Skin changes- pt reports some puffiness of face.  She was applying Cerave due to rough, dry, and dark skin under eyes and around mouth.  Has noticed darkening of back of neck.   Review of Systems For ROS see HPI     Objective:   Physical Exam Vitals reviewed.  Constitutional:      General: She is not in acute distress.    Appearance: Normal appearance. She is not ill-appearing.  HENT:     Head: Normocephalic and atraumatic.  Skin:    General: Skin is warm and dry.     Comments: Hyperpigmentation of posterior neck but no change in texture  Hyperpigmentation of lower face in a ring around mouth w/ dry, coarse skin  Hyperpigmentation under eyes bilaterally w/ dry, coarse skin  Neurological:     General: No focal deficit present.     Mental Status: She is alert and oriented to person, place, and time.  Psychiatric:        Mood and Affect: Mood normal.        Behavior: Behavior normal.        Thought Content: Thought content normal.           Assessment & Plan:  Hyperpigmentation- new.  Pt w/ areas under eyes bilaterally, around mouth, and posterior neck.  Reviewed that the change in skin color and texture on the neck can indicate insulin resistance/diabetes.  She admits that she is 'constantly' eating sweets.  Will check A1C.  Skin changes in color and texture on face will be referred to Dermatology.  Encouraged her to start Aveeno daily radiance facial moisturizer to help w/ tone and texture.  Pt expressed understanding and is in agreement w/ plan.

## 2024-03-22 NOTE — Patient Instructions (Signed)
 Follow up as needed or as scheduled We'll notify you of your lab results and make any changes if needed Try Aveeno Daily Radiant facial moisturizer We'll call you to schedule your Dermatology appt Call with any questions or concerns Hang In There!  We'll figure this out!!!

## 2024-04-02 NOTE — Telephone Encounter (Signed)
 Copied from CRM [id number]. Topic: Referral - Request for Referral >> Apr 02, 2024  9:08 AM [place] A wrote: Did the patient discuss referral with their provider in the last year? Yes (If No - schedule appointment) (If Yes - send message)  Appointment offered? Yes Patient had at on 5/29   Type of order/referral and detailed reason for visit: Dermatology  Preference of office, provider, location: N/A  If referral order, have you been seen by this specialty before? No (If Yes, this issue or another issue? When? Where?  Can we respond through MyChart? No

## 2024-04-26 ENCOUNTER — Ambulatory Visit: Payer: Self-pay

## 2024-04-26 ENCOUNTER — Ambulatory Visit: Admitting: Family Medicine

## 2024-04-26 ENCOUNTER — Encounter: Payer: Self-pay | Admitting: Family Medicine

## 2024-04-26 VITALS — BP 136/70 | HR 99 | Temp 99.4°F | Ht 66.0 in | Wt 183.4 lb

## 2024-04-26 DIAGNOSIS — R0981 Nasal congestion: Secondary | ICD-10-CM | POA: Diagnosis not present

## 2024-04-26 DIAGNOSIS — J069 Acute upper respiratory infection, unspecified: Secondary | ICD-10-CM

## 2024-04-26 LAB — POCT INFLUENZA A/B
Influenza A, POC: NEGATIVE
Influenza B, POC: NEGATIVE

## 2024-04-26 LAB — POC COVID19 BINAXNOW: SARS Coronavirus 2 Ag: NEGATIVE

## 2024-04-26 NOTE — Progress Notes (Signed)
 Subjective:  Patient ID: Hannah Ayala, female    DOB: Sep 02, 1961  Age: 63 y.o. MRN: [id number]  CC:  Chief Complaint  Patient presents with   Nasal Congestion    Pt has a cough, congestion, sinus pressure, notes no known fever, pt notes this started earlier in the week with sore throat     HPI Hannah Ayala presents for   Cough, nasal congestion Scratchy throat few days ago. Congestion, runny nose, cough. No fever/chills.  Allergy meds (benadryl) tried for sneezing - min relief. Tussin DM for cough - min relief No dyspnea.  Does not feel bad - just a nuisance. Sleeping ok, drinking fluids.      History Patient Active Problem List   Diagnosis Date Noted   Depression 09/08/2023   Vaccine counseling 11/19/2021   History of hematuria 05/07/2020   Overweight (BMI 25.0-29.9) 05/02/2019   Physical exam 04/21/2018   Vitamin D  deficiency 04/21/2018   Past Medical History:  Diagnosis Date   Allergy    seasonal allergies   Benign colonic polyp    History of chickenpox    Past Surgical History:  Procedure Laterality Date   ROBOTIC ASSISTED LAPAROSCOPIC HYSTERECTOMY AND SALPINGECTOMY     UMBILICAL HERNIA REPAIR     as an infant   WISDOM TOOTH EXTRACTION     Allergies  Allergen Reactions   Penicillin G Other (See Comments)    uknown   Prior to Admission medications   Medication Sig Start Date End Date Taking? Authorizing Provider  betamethasone  dipropionate 0.05 % cream Apply topically 2 (two) times daily. 11/03/23  Yes Tabori, Katherine E, MD  permethrin  (ELIMITE ) 5 % cream Apply 1 Application topically once for 1 dose. Repeat in 5 days 11/03/23 04/26/24 Yes Tabori, Katherine E, MD  Vitamin D , Ergocalciferol , (DRISDOL ) 1.25 MG (50000 UNIT) CAPS capsule Take 1 capsule (50,000 Units total) by mouth every 7 (seven) days. 10/11/23  Yes Mahlon Comer BRAVO, MD   Social History   Socioeconomic History   Marital status: Divorced    Spouse name: Not on  file   Number of children: Not on file   Years of education: Not on file   Highest education level: Not on file  Occupational History   Not on file  Tobacco Use   Smoking status: Never   Smokeless tobacco: Never  Vaping Use   Vaping status: Never Used  Substance and Sexual Activity   Alcohol use: No   Drug use: No   Sexual activity: Not Currently    Comment: 1st intercourse- 20, partners- ?, hysterectomy  Other Topics Concern   Not on file  Social History Narrative   Not on file   Social Drivers of Health   Financial Resource Strain: Low Risk  (03/22/2024)   Overall Financial Resource Strain (CARDIA)    Difficulty of Paying Living Expenses: Not hard at all  Food Insecurity: No Food Insecurity (03/22/2024)   Hunger Vital Sign    Worried About Running Out of Food in the Last Year: Never true    Ran Out of Food in the Last Year: Never true  Transportation Needs: No Transportation Needs (03/22/2024)   PRAPARE - Administrator, Civil Service (Medical): No    Lack of Transportation (Non-Medical): No  Physical Activity: Inactive (03/22/2024)   Exercise Vital Sign    Days of Exercise per Week: 0 days    Minutes of Exercise per Session: 0 min  Stress: No Stress Concern Present (  03/22/2024)   [employer] [employer] - Occupational Stress Questionnaire    Feeling of Stress : Not at all  Social Connections: Moderately Isolated (03/22/2024)   Social Connection and Isolation Panel    Frequency of Communication with Friends and Family: More than three times a week    Frequency of Social Gatherings with Friends and Family: More than three times a week    Attends Religious Services: 1 to 4 times per year    Active Member of [employer] or Organizations: Not on file    Attends Banker Meetings: Never    Marital Status: Divorced  Catering manager Violence: Not At Risk (03/22/2024)   Humiliation, Afraid, Rape, and Kick questionnaire    Fear of Current or  Ex-Partner: No    Emotionally Abused: No    Physically Abused: No    Sexually Abused: No    Review of Systems   Objective:   Vitals:   04/26/24 1459  BP: 136/70  Pulse: 99  Temp: 99.4 F (37.4 C)  TempSrc: Temporal  SpO2: 98%  Weight: 183 lb 6.4 oz (83.2 kg)  Height: 5' 6 (1.676 m)     Physical Exam Vitals reviewed.  Constitutional:      General: She is not in acute distress.    Appearance: She is well-developed.  HENT:     Head: Normocephalic and atraumatic.     Right Ear: Hearing, tympanic membrane, ear canal and external ear normal.     Left Ear: Hearing, tympanic membrane, ear canal and external ear normal.     Nose: Nose normal.     Comments: Sinuses nontender.     Mouth/Throat:     Mouth: Mucous membranes are moist.     Pharynx: Oropharynx is clear. No posterior oropharyngeal erythema.  Eyes:     Conjunctiva/sclera: Conjunctivae normal.     Pupils: Pupils are equal, round, and reactive to light.  Cardiovascular:     Rate and Rhythm: Normal rate and regular rhythm.     Heart sounds: Normal heart sounds. No murmur heard. Pulmonary:     Effort: Pulmonary effort is normal. No respiratory distress.     Breath sounds: Normal breath sounds. No wheezing or rhonchi.  Skin:    General: Skin is warm and dry.     Findings: No rash.  Neurological:     Mental Status: She is alert and oriented to person, place, and time.  Psychiatric:        Mood and Affect: Mood normal.        Behavior: Behavior normal.     Results for orders placed or performed in visit on 04/26/24  POC COVID-19 BinaxNow   Collection Time: 04/26/24  3:13 PM  Result Value Ref Range   SARS Coronavirus 2 Ag Negative Negative  POCT Influenza A/B   Collection Time: 04/26/24  3:14 PM  Result Value Ref Range   Influenza A, POC Negative Negative   Influenza B, POC Negative Negative     Assessment & Plan:  Hannah Ayala is a 63 y.o. female . Congestion of nasal sinus - Plan: POC  COVID-19 BinaxNow, POCT Influenza A/B  Upper respiratory tract infection, unspecified type Suspected viral URI.  Reassuring exam.  Negative COVID and flu testing as above.  Symptomatic care discussed, handout given, RTC precautions given.  Tessalon  Perles offered, declined at this time.  No orders of the defined types were placed in this encounter.  Patient Instructions  Thank you for coming  in today.  COVID and flu testing was negative or normal.  I suspect you do have a cold or viral infection.  See information below.  Over-the-counter Robitussin or Mucinex is fine as needed for cough, make sure to drink plenty of fluids and rest as needed.  Saline nasal spray and sometimes Flonase nasal spray can help with congestion, especially if there may be an allergy component.  If any new or worsening symptoms be seen but I expect you to be improving through the weekend into next week.  Take care!  Upper Respiratory Infection, Adult An upper respiratory infection (URI) is a common viral infection of the nose, throat, and upper air passages that lead to the lungs. The most common type of URI is the common cold. URIs usually get better on their own, without medical treatment. What are the causes? A URI is caused by a virus. You may catch a virus by: Breathing in droplets from an infected person's cough or sneeze. Touching something that has been exposed to the virus (is contaminated) and then touching your mouth, nose, or eyes. What increases the risk? You are more likely to get a URI if: You are very young or very old. You have close contact with others, such as at work, school, or a health care facility. You smoke. You have long-term (chronic) heart or lung disease. You have a weakened disease-fighting system (immune system). You have nasal allergies or asthma. You are experiencing a lot of stress. You have poor nutrition. What are the signs or symptoms? A URI usually involves some of the  following symptoms: Runny or stuffy (congested) nose. Cough. Sneezing. Sore throat. Headache. Fatigue. Fever. Loss of appetite. Pain in your forehead, behind your eyes, and over your cheekbones (sinus pain). Muscle aches. Redness or irritation of the eyes. Pressure in the ears or face. How is this diagnosed? This condition may be diagnosed based on your medical history and symptoms, and a physical exam. Your health care provider may use a swab to take a mucus sample from your nose (nasal swab). This sample can be tested to determine what virus is causing the illness. How is this treated? URIs usually get better on their own within 7-10 days. Medicines cannot cure URIs, but your health care provider may recommend certain medicines to help relieve symptoms, such as: Over-the-counter cold medicines. Cough suppressants. Coughing is a type of defense against infection that helps to clear the respiratory system, so take these medicines only as recommended by your health care provider. Fever-reducing medicines. Follow these instructions at home: Activity Rest as needed. If you have a fever, stay home from work or school until your fever is gone or until your health care provider says your URI cannot spread to other people (is no longer contagious). Your health care provider may have you wear a face mask to prevent your infection from spreading. Relieving symptoms Gargle with a mixture of salt and water 3-4 times a day or as needed. To make salt water, completely dissolve -1 tsp (3-6 g) of salt in 1 cup (237 mL) of warm water. Use a cool-mist humidifier to add moisture to the air. This can help you breathe more easily. Eating and drinking  Drink enough fluid to keep your urine pale yellow. Eat soups and other clear broths. General instructions  Take over-the-counter and prescription medicines only as told by your health care provider. These include cold medicines, fever reducers, and cough  suppressants. Do not use any products that  contain nicotine or tobacco. These products include cigarettes, chewing tobacco, and vaping devices, such as e-cigarettes. If you need help quitting, ask your health care provider. Stay away from secondhand smoke. Stay up to date on all immunizations, including the yearly (annual) flu vaccine. Keep all follow-up visits. This is important. How to prevent the spread of infection to others URIs can be contagious. To prevent the infection from spreading: Wash your hands with soap and water for at least 20 seconds. If soap and water are not available, use hand sanitizer. Avoid touching your mouth, face, eyes, or nose. Cough or sneeze into a tissue or your sleeve or elbow instead of into your hand or into the air.  Contact a health care provider if: You are getting worse instead of better. You have a fever or chills. Your mucus is brown or red. You have yellow or brown discharge coming from your nose. You have pain in your face, especially when you bend forward. You have swollen neck glands. You have pain while swallowing. You have white areas in the back of your throat. Get help right away if: You have shortness of breath that gets worse. You have severe or persistent: Headache. Ear pain. Sinus pain. Chest pain. You have chronic lung disease along with any of the following: Making high-pitched whistling sounds when you breathe, most often when you breathe out (wheezing). Prolonged cough (more than 14 days). Coughing up blood. A change in your usual mucus. You have a stiff neck. You have changes in your: Vision. Hearing. Thinking. Mood. These symptoms may be an emergency. Get help right away. Call 911. Do not wait to see if the symptoms will go away. Do not drive yourself to the hospital. Summary An upper respiratory infection (URI) is a common infection of the nose, throat, and upper air passages that lead to the lungs. A URI is caused  by a virus. URIs usually get better on their own within 7-10 days. Medicines cannot cure URIs, but your health care provider may recommend certain medicines to help relieve symptoms. This information is not intended to replace advice given to you by your health care provider. Make sure you discuss any questions you have with your health care provider. Document Revised: 05/13/2021 Document Reviewed: 05/13/2021 Elsevier Patient Education  2024 Elsevier Inc.    Signed,   Reyes Pines, MD [facility], [facility] 04/26/24 3:27 PM

## 2024-04-26 NOTE — Telephone Encounter (Signed)
 FYI Only or Action Required?: Action required by provider: clinical question for provider.  Patient was last seen in primary care on 03/22/2024 by Mahlon Comer BRAVO, MD. Called Nurse Triage reporting Nasal Congestion. Symptoms began a week ago. Interventions attempted: OTC medications: allergy medication (could not recall name) and store brand Robitussin. Symptoms are: stable.  Triage Disposition: Home Care  Patient/caregiver understands and will follow disposition?: Yes                             Copied from CRM [id number]. Topic: Clinical - Medical Advice >> Apr 26, 2024  8:38 AM Thersia C wrote: Reason for CRM: patient has a running nose and cough , beleives it she has allergies and wants nurse to call her back so she can know what she can do for it    ----------------------------------------------------------------------- From previous Reason for Contact - Pink Word Triage: Reason for Triage: Reason for Disposition  [1] Sinus congestion as part of a cold AND [2] present < 10 days  Answer Assessment - Initial Assessment Questions 1. LOCATION: Where does it hurt?      Denies 2. ONSET: When did the sinus pain start?  (e.g., hours, days)      A week 4. RECURRENT SYMPTOM: Have you ever had sinus problems before? If Yes, ask: When was the last time? and What happened that time?      Denies 5. NASAL CONGESTION: Is the nose blocked? If Yes, ask: Can you open it or must you breathe through your mouth?     States she is able to breathe 6. NASAL DISCHARGE: Do you have discharge from your nose? If so ask, What color?     Clear 7. FEVER: Do you have a fever? If Yes, ask: What is it, how was it measured, and when did it start?      Denies  8. OTHER SYMPTOMS: Do you have any other symptoms? (e.g., sore throat, cough, earache, difficulty breathing)     Cough, sneezing    Patient is seeking provider's recommendation on medications she can take  to help improve symptoms. Patient states she has taken an allergy medication (could not recall name) and [employer] brand of Robitussin. Patient states these medications have not helped. Patient would like a call back with provider's input.  Protocols used: Sinus Pain or Congestion-A-AH

## 2024-04-26 NOTE — Patient Instructions (Signed)
 Thank you for coming in today.  COVID and flu testing was negative or normal.  I suspect you do have a cold or viral infection.  See information below.  Over-the-counter Robitussin or Mucinex is fine as needed for cough, make sure to drink plenty of fluids and rest as needed.  Saline nasal spray and sometimes Flonase nasal spray can help with congestion, especially if there may be an allergy component.  If any new or worsening symptoms be seen but I expect you to be improving through the weekend into next week.  Take care!  Upper Respiratory Infection, Adult An upper respiratory infection (URI) is a common viral infection of the nose, throat, and upper air passages that lead to the lungs. The most common type of URI is the common cold. URIs usually get better on their own, without medical treatment. What are the causes? A URI is caused by a virus. You may catch a virus by: Breathing in droplets from an infected person's cough or sneeze. Touching something that has been exposed to the virus (is contaminated) and then touching your mouth, nose, or eyes. What increases the risk? You are more likely to get a URI if: You are very young or very old. You have close contact with others, such as at work, school, or a health care facility. You smoke. You have long-term (chronic) heart or lung disease. You have a weakened disease-fighting system (immune system). You have nasal allergies or asthma. You are experiencing a lot of stress. You have poor nutrition. What are the signs or symptoms? A URI usually involves some of the following symptoms: Runny or stuffy (congested) nose. Cough. Sneezing. Sore throat. Headache. Fatigue. Fever. Loss of appetite. Pain in your forehead, behind your eyes, and over your cheekbones (sinus pain). Muscle aches. Redness or irritation of the eyes. Pressure in the ears or face. How is this diagnosed? This condition may be diagnosed based on your medical history and  symptoms, and a physical exam. Your health care provider may use a swab to take a mucus sample from your nose (nasal swab). This sample can be tested to determine what virus is causing the illness. How is this treated? URIs usually get better on their own within 7-10 days. Medicines cannot cure URIs, but your health care provider may recommend certain medicines to help relieve symptoms, such as: Over-the-counter cold medicines. Cough suppressants. Coughing is a type of defense against infection that helps to clear the respiratory system, so take these medicines only as recommended by your health care provider. Fever-reducing medicines. Follow these instructions at home: Activity Rest as needed. If you have a fever, stay home from work or school until your fever is gone or until your health care provider says your URI cannot spread to other people (is no longer contagious). Your health care provider may have you wear a face mask to prevent your infection from spreading. Relieving symptoms Gargle with a mixture of salt and water 3-4 times a day or as needed. To make salt water, completely dissolve -1 tsp (3-6 g) of salt in 1 cup (237 mL) of warm water. Use a cool-mist humidifier to add moisture to the air. This can help you breathe more easily. Eating and drinking  Drink enough fluid to keep your urine pale yellow. Eat soups and other clear broths. General instructions  Take over-the-counter and prescription medicines only as told by your health care provider. These include cold medicines, fever reducers, and cough suppressants. Do not use  any products that contain nicotine or tobacco. These products include cigarettes, chewing tobacco, and vaping devices, such as e-cigarettes. If you need help quitting, ask your health care provider. Stay away from secondhand smoke. Stay up to date on all immunizations, including the yearly (annual) flu vaccine. Keep all follow-up visits. This is  important. How to prevent the spread of infection to others URIs can be contagious. To prevent the infection from spreading: Wash your hands with soap and water for at least 20 seconds. If soap and water are not available, use hand sanitizer. Avoid touching your mouth, face, eyes, or nose. Cough or sneeze into a tissue or your sleeve or elbow instead of into your hand or into the air.  Contact a health care provider if: You are getting worse instead of better. You have a fever or chills. Your mucus is brown or red. You have yellow or brown discharge coming from your nose. You have pain in your face, especially when you bend forward. You have swollen neck glands. You have pain while swallowing. You have white areas in the back of your throat. Get help right away if: You have shortness of breath that gets worse. You have severe or persistent: Headache. Ear pain. Sinus pain. Chest pain. You have chronic lung disease along with any of the following: Making high-pitched whistling sounds when you breathe, most often when you breathe out (wheezing). Prolonged cough (more than 14 days). Coughing up blood. A change in your usual mucus. You have a stiff neck. You have changes in your: Vision. Hearing. Thinking. Mood. These symptoms may be an emergency. Get help right away. Call 911. Do not wait to see if the symptoms will go away. Do not drive yourself to the hospital. Summary An upper respiratory infection (URI) is a common infection of the nose, throat, and upper air passages that lead to the lungs. A URI is caused by a virus. URIs usually get better on their own within 7-10 days. Medicines cannot cure URIs, but your health care provider may recommend certain medicines to help relieve symptoms. This information is not intended to replace advice given to you by your health care provider. Make sure you discuss any questions you have with your health care provider. Document Revised:  05/13/2021 Document Reviewed: 05/13/2021 Elsevier Patient Education  2024 [employer].

## 2024-04-26 NOTE — Telephone Encounter (Signed)
 Patient needs an appt for treatment of new sxs

## 2024-04-26 NOTE — Telephone Encounter (Signed)
 Patient scheduled this PM with Dr. Levora

## 2024-06-14 ENCOUNTER — Ambulatory Visit: Payer: Self-pay

## 2024-06-14 NOTE — Telephone Encounter (Signed)
 Called patient to see if she can be seen tomorrow. Patient asked if she could get seen next week with Dr. Mahlon or Dr. Levora. I scheduled with Dr. Levora as he has the soonest opening for patients schedule.   Please see triage below

## 2024-06-14 NOTE — Telephone Encounter (Signed)
 FYI Only or Action Required?: Action required by provider: recommendations for patient's symptoms.  Patient was last seen in primary care on 04/26/2024 by Hannah Reyes SAUNDERS, MD.  Called Nurse Triage reporting Nail concern.  Symptoms began several days ago.  Interventions attempted: Rest, hydration, or home remedies.  Symptoms are: unchanged.  Triage Disposition: See PCP Within 2 Weeks-needing a call back  Patient/caregiver understands and will follow disposition?: No, wishes to speak with PCP  Copied from CRM #[id number]. Topic: Clinical - Red Word Triage >> Jun 14, 2024  2:30 PM Hannah Ayala wrote: Red Word that prompted transfer to Nurse Triage: stomach pain Reason for Disposition  Thick, ugly-appearing toenail  Answer Assessment - Initial Assessment Questions 1. MAIN CONCERN OR SYMPTOM: What is your main concern right now?      White chalky patches to her toenails 2. LOCATION: Which toe?      Patient states each toenail has white spots 3. APPEARANCE: What does toenail look like? (e.g., thickened, white, yellow, or brown color)     White  4. ONSET: When did it start? (e.g., days, weeks, months ago)     Last week 5. PAIN: Is there any pain? If Yes, ask: How bad is the pain? (Scale 0-10; or none, mild, moderate, severe)     no 6. REDNESS: Is there any redness of the skin? If Yes, ask: How much of the toe is red?     no 7. OTHER SYMPTOMS: Do you have any other symptoms? (e.g., fever, pus, red streak up foot)     no  Protocols used: Toenail Fungal Infection - Diagnosed or Suspected-A-AH

## 2024-06-21 ENCOUNTER — Encounter: Payer: Self-pay | Admitting: Family Medicine

## 2024-06-21 ENCOUNTER — Ambulatory Visit: Admitting: Family Medicine

## 2024-06-21 VITALS — BP 108/76 | HR 85 | Temp 97.9°F | Resp 14 | Ht 66.0 in | Wt 181.6 lb

## 2024-06-21 DIAGNOSIS — L608 Other nail disorders: Secondary | ICD-10-CM

## 2024-06-21 DIAGNOSIS — E559 Vitamin D deficiency, unspecified: Secondary | ICD-10-CM

## 2024-06-21 DIAGNOSIS — L603 Nail dystrophy: Secondary | ICD-10-CM

## 2024-06-21 LAB — VITAMIN D 25 HYDROXY (VIT D DEFICIENCY, FRACTURES): VITD: 15.55 ng/mL — ABNORMAL LOW (ref 30.00–100.00)

## 2024-06-21 MED ORDER — VITAMIN D (ERGOCALCIFEROL) 1.25 MG (50000 UNIT) PO CAPS: 50000.0000 [IU] | ORAL_CAPSULE | ORAL | 0 refills | Status: DC

## 2024-06-21 NOTE — Progress Notes (Signed)
 Subjective:  Patient ID: Hannah Ayala, female    DOB: 04-Apr-1961  Age: 63 y.o. MRN: [id number]  CC:  Chief Complaint  Patient presents with   Acute Visit    White spots on toenails. Sx started awhile ago. Both feet. Does not itch or hurt. Nails are starting to split.     HPI Hannah Ayala presents for   Nail abnormality: Both feet, toenails. White spots noted in various toenails - months to year. . For awhile. Usually toenails were painted, currently clear and able to see now. Pedicures and nails polished every 2-4 weeks, with removal of prior polish. No manicure or repetitive fingernail polishing.  Has had some splitting at end of great toenail on right.   No hx of psoriasis or skin d/o. No other skin rash.  Does have a hx of vitamin D  deficiency.  Prior Rx treatment has run out past few months, then otc supplement - unknown dose, irregular dosing.   Appt with derm in January for darkened areas of skin.   Last vitamin D  Lab Results  Component Value Date   VD25OH 13.94 (L) 10/07/2023     History Patient Active Problem List   Diagnosis Date Noted   Depression 09/08/2023   Vaccine counseling 11/19/2021   History of hematuria 05/07/2020   Overweight (BMI 25.0-29.9) 05/02/2019   Physical exam 04/21/2018   Vitamin D  deficiency 04/21/2018   Past Medical History:  Diagnosis Date   Allergy    seasonal allergies   Benign colonic polyp    History of chickenpox    Past Surgical History:  Procedure Laterality Date   ROBOTIC ASSISTED LAPAROSCOPIC HYSTERECTOMY AND SALPINGECTOMY     UMBILICAL HERNIA REPAIR     as an infant   WISDOM TOOTH EXTRACTION     Allergies  Allergen Reactions   Penicillin G Other (See Comments)    uknown   Prior to Admission medications   Medication Sig Start Date End Date Taking? Authorizing Provider  Vitamin D , Ergocalciferol , (DRISDOL ) 1.25 MG (50000 UNIT) CAPS capsule Take 1 capsule (50,000 Units total) by mouth  every 7 (seven) days. 10/11/23  Yes Tabori, Katherine E, MD  betamethasone  dipropionate 0.05 % cream Apply topically 2 (two) times daily. Patient not taking: Reported on 06/21/2024 11/03/23   Tabori, Katherine E, MD  permethrin  (ELIMITE ) 5 % cream Apply 1 Application topically once for 1 dose. Repeat in 5 days Patient not taking: Reported on 06/21/2024 11/03/23 06/21/24  Mahlon Comer BRAVO, MD   Social History   Socioeconomic History   Marital status: Divorced    Spouse name: Not on file   Number of children: Not on file   Years of education: Not on file   Highest education level: Not on file  Occupational History   Not on file  Tobacco Use   Smoking status: Never   Smokeless tobacco: Never  Vaping Use   Vaping status: Never Used  Substance and Sexual Activity   Alcohol use: No   Drug use: No   Sexual activity: Not Currently    Comment: 1st intercourse- 20, partners- ?, hysterectomy  Other Topics Concern   Not on file  Social History Narrative   Not on file   Social Drivers of Health   Financial Resource Strain: Low Risk  (03/22/2024)   Overall Financial Resource Strain (CARDIA)    Difficulty of Paying Living Expenses: Not hard at all  Food Insecurity: No Food Insecurity (03/22/2024)   Hunger Vital Sign  Worried About Programme researcher, broadcasting/film/video in the Last Year: Never true    Ran Out of Food in the Last Year: Never true  Transportation Needs: No Transportation Needs (03/22/2024)   PRAPARE - Administrator, Civil Service (Medical): No    Lack of Transportation (Non-Medical): No  Physical Activity: Inactive (03/22/2024)   Exercise Vital Sign    Days of Exercise per Week: 0 days    Minutes of Exercise per Session: 0 min  Stress: No Stress Concern Present (03/22/2024)   [employer] of Occupational Health - Occupational Stress Questionnaire    Feeling of Stress : Not at all  Social Connections: Moderately Isolated (03/22/2024)   Social Connection and Isolation Panel     Frequency of Communication with Friends and Family: More than three times a week    Frequency of Social Gatherings with Friends and Family: More than three times a week    Attends Religious Services: 1 to 4 times per year    Active Member of [employer] or Organizations: Not on file    Attends Banker Meetings: Never    Marital Status: Divorced  Catering manager Violence: Not At Risk (03/22/2024)   Humiliation, Afraid, Rape, and Kick questionnaire    Fear of Current or Ex-Partner: No    Emotionally Abused: No    Physically Abused: No    Sexually Abused: No    Review of Systems Per HPI  Objective:   Vitals:   06/21/24 0817  BP: 108/76  Pulse: 85  Resp: 14  Temp: 97.9 F (36.6 C)  TempSrc: Temporal  SpO2: 98%  Weight: 181 lb 9.6 oz (82.4 kg)  Height: 5' 6 (1.676 m)     Physical Exam Constitutional:      General: She is not in acute distress.    Appearance: Normal appearance. She is well-developed.  HENT:     Head: Normocephalic and atraumatic.  Cardiovascular:     Rate and Rhythm: Normal rate.  Pulmonary:     Effort: Pulmonary effort is normal.  Skin:    Comments: See photos, great toe nail with slight split distally, punctate white areas throughout great toenails, few and smaller toenails.  Fingernails are uninvolved.  No surrounding skin rash.  Neurological:     Mental Status: She is alert and oriented to person, place, and time.  Psychiatric:        Mood and Affect: Mood normal.         Assessment & Plan:  Hannah Ayala is a 63 y.o. female . 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  Also with vitamin D  deficiency that may be contributing to nail health and distal splitting.  No known history of psoriasis and without other rash unlikely.   Hold on nail treatments for now, check vitamin D  level  and repeat prescription for prescription strength vitamin D  given if low on testing today.  She does have a dermatology follow-up at that is not for a few months.  Recommended follow-up with PCP if symptoms are not improving, sooner if worse.  Meds ordered this encounter  Medications   Vitamin D , Ergocalciferol , (DRISDOL ) 1.25 MG (50000 UNIT) CAPS capsule    Sig: Take 1 capsule (50,000 Units total) by mouth every 7 (seven) days.    Dispense:  12 capsule    Refill:  0  Patient Instructions  Thank you for coming in today.  As we discussed there can be many different reasons for discoloration or white spots in the nails.  Typically small white spots are due to trauma.  I am suspicious that this may be the case since you have more treatments to the toenails than the fingernails and I do not see any significant abnormalities of your fingernails.  Vitamin D  deficiency may also be playing a part.  Continue to avoid pedicures or painting toenails for now to see if that may allow those areas to improve.  I suspect you will need to restart prescription vitamin D , I will check your levels today but have also sent in the prescription to your pharmacy.  Please follow-up with your primary care provider if these areas are not improving, and keep follow-up as planned with dermatology.  Take care!      Signed,   Reyes Pines, MD [facility], [facility] 06/21/24 8:46 AM

## 2024-06-21 NOTE — Patient Instructions (Signed)
 Thank you for coming in today.  As we discussed there can be many different reasons for discoloration or white spots in the nails.  Typically small white spots are due to trauma.  I am suspicious that this may be the case since you have more treatments to the toenails than the fingernails and I do not see any significant abnormalities of your fingernails.  Vitamin D  deficiency may also be playing a part.  Continue to avoid pedicures or painting toenails for now to see if that may allow those areas to improve.  I suspect you will need to restart prescription vitamin D , I will check your levels today but have also sent in the prescription to your pharmacy.  Please follow-up with your primary care provider if these areas are not improving, and keep follow-up as planned with dermatology.  Take care!

## 2024-06-25 ENCOUNTER — Other Ambulatory Visit: Payer: Self-pay | Admitting: Family Medicine

## 2024-06-25 ENCOUNTER — Ambulatory Visit: Payer: Self-pay | Admitting: Family Medicine

## 2024-06-26 NOTE — Progress Notes (Signed)
 Called patient to relay lab results. Patient is aware. She will pick up Vit D supplement from pharmacy.

## 2024-07-02 ENCOUNTER — Ambulatory Visit: Payer: Self-pay

## 2024-07-02 NOTE — Telephone Encounter (Signed)
 If her symptoms are severe, they change, or worsen- she will need to go to the ER for immediate evaluation.  I don't want her to sacrifice her health waiting on an appt w/ me

## 2024-07-02 NOTE — Telephone Encounter (Signed)
 FYI Only or Action Required?: Action required by provider: update on patient condition.  Patient was last seen in primary care on 06/21/2024 by Levora Reyes SAUNDERS, MD.  Called Nurse Triage reporting Headache.  Symptoms began several days ago.  Interventions attempted: Rest, hydration, or home remedies.  Symptoms are: unchanged.  Triage Disposition: See PCP When Office is Open (Within 3 Days)  Patient/caregiver understands and will follow disposition?: Yes         Copied from CRM [id number]. Topic: Clinical - Red Word Triage >> Jul 02, 2024 11:20 AM Mercedes MATSU wrote: Red Word that prompted transfer to Nurse Triage: Patient called in stating that she is having really bad right side head pain, along with a sore scalp since Friday 06/29/2024. Patient requested to speak to a nurse to get her opinion on if she should wait for her upcoming appointment that she has scheduled 07/04/2024. Reason for Disposition  [1] New-onset headache AND [2] age > 50 years  Answer Assessment - Initial Assessment Questions 1. LOCATION: Where does it hurt?      R side head pain 2. ONSET: When did the headache start? (e.g., minutes, hours, days)      Friday night, now scalp sore to touch with shooting pain 3. PATTERN: Does the pain come and go, or has it been constant since it started?     Comes and goes 4. SEVERITY: How bad is the pain? and What does it keep you from doing?  (e.g., Scale 1-10; mild, moderate, or severe)     Endorses sharp, shooting pain - it's fleeting 5. RECURRENT SYMPTOM: Have you ever had headaches before? If Yes, ask: When was the last time? and What happened that time?      First time 6. CAUSE: What do you think is causing the headache?     unknown 7. MIGRAINE: Have you been diagnosed with migraine headaches? If Yes, ask: Is this headache similar?      N/a 8. HEAD INJURY: Has there been any recent injury to your head?      denies 9. OTHER SYMPTOMS: Do you  have any other symptoms? (e.g., fever, stiff neck, eye pain, sore throat, cold symptoms)     Tender at scalp and behind R ear 10. PREGNANCY: Is there any chance you are pregnant? When was your last menstrual period?       N/a    Triager attempted to schedule earlier AV, but no access.   Triager will forward encounter for PCP 's office to review and advise. Patient verbalized understanding and is expecting call back from office for next steps, if any. Pt is planning on keeping AV appt this coming Wednesday.  Protocols used: [facility]

## 2024-07-02 NOTE — Telephone Encounter (Signed)
 Patient wants to wait to see you. She does not want to see anyone else.

## 2024-07-02 NOTE — Telephone Encounter (Signed)
 Is there an opening at another office for pt to be seen sooner?

## 2024-07-02 NOTE — Telephone Encounter (Signed)
 She promises she will go to the ED if anything changes.

## 2024-07-04 ENCOUNTER — Encounter: Payer: Self-pay | Admitting: Family Medicine

## 2024-07-04 ENCOUNTER — Ambulatory Visit: Admitting: Family Medicine

## 2024-07-04 VITALS — BP 122/78 | HR 90 | Temp 98.0°F | Ht 66.0 in | Wt 182.0 lb

## 2024-07-04 DIAGNOSIS — M5481 Occipital neuralgia: Secondary | ICD-10-CM

## 2024-07-04 DIAGNOSIS — Z23 Encounter for immunization: Secondary | ICD-10-CM

## 2024-07-04 MED ORDER — METHOCARBAMOL 500 MG PO TABS: 500.0000 mg | ORAL_TABLET | Freq: Three times a day (TID) | ORAL | 0 refills | Status: DC | PRN

## 2024-07-04 MED ORDER — PREDNISONE 10 MG PO TABS: ORAL_TABLET | ORAL | 0 refills | Status: DC

## 2024-07-04 NOTE — Progress Notes (Signed)
   Subjective:    Patient ID: Hannah Ayala, female    DOB: Nov 25, 1960, 63 y.o.   MRN: [id number]  HPI Head pain- pt reports she developed sharp pain on R side of her head on 9/5.  Scalp was TTP.  It hurts to wash her hair.  Pain seems to come up from R posterior neck and comes around.  Pain is intermittent.  Today pt reports neck is fine but does have some mild scalp sensitivity at top of head.   Review of Systems For ROS see HPI     Objective:   Physical Exam Vitals reviewed.  Constitutional:      General: She is not in acute distress.    Appearance: She is well-developed. She is not ill-appearing.  HENT:     Head: Normocephalic and atraumatic.  Eyes:     Extraocular Movements: Extraocular movements intact.     Right eye: Normal extraocular motion.     Left eye: Normal extraocular motion.     Pupils: Pupils are equal, round, and reactive to light.  Musculoskeletal:     Cervical back: Normal range of motion and neck supple. No rigidity.  Lymphadenopathy:     Cervical: No cervical adenopathy.  Skin:    General: Skin is warm and dry.     Findings: No rash.  Neurological:     Mental Status: She is alert.     Cranial Nerves: No cranial nerve deficit, dysarthria or facial asymmetry.     Coordination: Coordination normal.     Gait: Gait normal.  Psychiatric:        Mood and Affect: Mood normal.        Speech: Speech normal.        Behavior: Behavior normal.           Assessment & Plan:  Occipital neuralgia- new.  Pt's description of pain and the location is consistent w/ occipital neuralgia.  Will get xray to assess Cspine for possible structural issue.  Will start Prednisone  taper for inflammation and pain and will start methocarbamol  as needed for muscle spasm.  If no improvement she will let me know and we can refer to neuro.  Pt expressed understanding and is in agreement w/ plan.

## 2024-07-04 NOTE — Patient Instructions (Signed)
 Follow up as needed or as scheduled Go to [facility] on [address] and get your xray (enter thru the main doors, radiology is to the R of the elevator) START the Prednisone  as directed- 3 pills at the same time x3 days, then 2 pills at the same time x3 days, then 1 pill daily.  Take w/ food  USE the methocarbamol  as needed for neck pain/spasm/tightness HEAT as needed If no improvement or worsening, please let me know so we can take the next steps Call with any questions or concerns Hang in there!

## 2024-07-05 ENCOUNTER — Ambulatory Visit (HOSPITAL_BASED_OUTPATIENT_CLINIC_OR_DEPARTMENT_OTHER)
Admission: RE | Admit: 2024-07-05 | Discharge: 2024-07-05 | Disposition: A | Discharge status: Home / Self Care | Source: Ambulatory Visit | Attending: Family Medicine | Admitting: Family Medicine

## 2024-07-05 DIAGNOSIS — M5481 Occipital neuralgia: Secondary | ICD-10-CM | POA: Insufficient documentation

## 2024-07-13 ENCOUNTER — Ambulatory Visit: Payer: Self-pay | Admitting: Family Medicine

## 2024-07-13 NOTE — Progress Notes (Signed)
 Called patient to relay results. Left vm to return call

## 2024-07-13 NOTE — Telephone Encounter (Signed)
 Copied from CRM [id number]. Topic: Clinical - Lab/Test Results >> Jul 13, 2024 11:08 AM Rea ORN wrote: Reason for CRM: Pt called Hannah Ayala  back and was given her xray results.

## 2024-09-13 ENCOUNTER — Telehealth: Payer: Self-pay

## 2024-09-13 NOTE — Telephone Encounter (Signed)
 Patient had flu shot on 07/04/24. Called patient and let her know that she received hers. Pharmacy is saying that she has not had it.

## 2024-09-13 NOTE — Telephone Encounter (Signed)
 Copied from CRM [id number]. Topic: Clinical - Medication Question >> Sep 13, 2024  2:35 PM Deaijah H wrote: Reason for CRM: Patient would like for a nurse to give a call to confirm whether or not she's had a flu sot this year. Per chart I do not show documentation of flu shot. Please call.

## 2024-09-20 ENCOUNTER — Other Ambulatory Visit: Payer: Self-pay | Admitting: Family Medicine

## 2024-10-08 ENCOUNTER — Encounter: Payer: Self-pay | Admitting: Family Medicine

## 2024-10-08 ENCOUNTER — Ambulatory Visit: Payer: BC Managed Care – PPO | Admitting: Family Medicine

## 2024-10-08 VITALS — BP 118/80 | HR 76 | Temp 97.9°F | Ht 65.5 in | Wt 186.6 lb

## 2024-10-08 DIAGNOSIS — E559 Vitamin D deficiency, unspecified: Secondary | ICD-10-CM

## 2024-10-08 DIAGNOSIS — R7303 Prediabetes: Secondary | ICD-10-CM

## 2024-10-08 DIAGNOSIS — Z0000 Encounter for general adult medical examination without abnormal findings: Secondary | ICD-10-CM | POA: Diagnosis not present

## 2024-10-08 DIAGNOSIS — E669 Obesity, unspecified: Secondary | ICD-10-CM | POA: Diagnosis not present

## 2024-10-08 LAB — VITAMIN D 25 HYDROXY (VIT D DEFICIENCY, FRACTURES): VITD: 26.95 ng/mL — ABNORMAL LOW (ref 30.00–100.00)

## 2024-10-08 LAB — LIPID PANEL
Cholesterol: 190 mg/dL (ref 0–200)
HDL: 43.1 mg/dL (ref >39.00)
LDL Cholesterol: 125 mg/dL — ABNORMAL HIGH (ref 0–99)
NonHDL: 147.28
Total CHOL/HDL Ratio: 4
Triglycerides: 109 mg/dL (ref 0.0–149.0)
VLDL: 21.8 mg/dL (ref 0.0–40.0)

## 2024-10-08 LAB — BASIC METABOLIC PANEL WITH GFR
BUN: 9 mg/dL (ref 6–23)
CO2: 26 meq/L (ref 19–32)
Calcium: 9.5 mg/dL (ref 8.4–10.5)
Chloride: 105 meq/L (ref 96–112)
Creatinine, Ser: 0.75 mg/dL (ref 0.40–1.20)
GFR: 85.69 mL/min (ref >60.00)
Glucose, Bld: 105 mg/dL — ABNORMAL HIGH (ref 70–99)
Potassium: 4.1 meq/L (ref 3.5–5.1)
Sodium: 137 meq/L (ref 135–145)

## 2024-10-08 LAB — HEPATIC FUNCTION PANEL
ALT: 16 U/L (ref 0–35)
AST: 16 U/L (ref 0–37)
Albumin: 4.5 g/dL (ref 3.5–5.2)
Alkaline Phosphatase: 101 U/L (ref 39–117)
Bilirubin, Direct: 0.1 mg/dL (ref 0.0–0.3)
Total Bilirubin: 0.8 mg/dL (ref 0.2–1.2)
Total Protein: 7.2 g/dL (ref 6.0–8.3)

## 2024-10-08 LAB — CBC WITH DIFFERENTIAL/PLATELET
Basophils Absolute: 0 K/uL (ref 0.0–0.1)
Basophils Relative: 0.9 % (ref 0.0–3.0)
Eosinophils Absolute: 0.1 K/uL (ref 0.0–0.7)
Eosinophils Relative: 2.5 % (ref 0.0–5.0)
HCT: 39.5 % (ref 36.0–46.0)
Hemoglobin: 13.2 g/dL (ref 12.0–15.0)
Lymphocytes Relative: 38.9 % (ref 12.0–46.0)
Lymphs Abs: 2 K/uL (ref 0.7–4.0)
MCHC: 33.3 g/dL (ref 30.0–36.0)
MCV: 82.5 fl (ref 78.0–100.0)
Monocytes Absolute: 0.5 K/uL (ref 0.1–1.0)
Monocytes Relative: 10 % (ref 3.0–12.0)
Neutro Abs: 2.5 K/uL (ref 1.4–7.7)
Neutrophils Relative %: 47.7 % (ref 43.0–77.0)
Platelets: 281 K/uL (ref 150.0–400.0)
RBC: 4.78 Mil/uL (ref 3.87–5.11)
RDW: 14.8 % (ref 11.5–15.5)
WBC: 5.1 K/uL (ref 4.0–10.5)

## 2024-10-08 LAB — HEMOGLOBIN A1C: Hgb A1c MFr Bld: 6.4 % (ref 4.6–6.5)

## 2024-10-08 LAB — TSH: TSH: 0.73 u[IU]/mL (ref 0.35–5.50)

## 2024-10-08 NOTE — Patient Instructions (Signed)
Follow up in 1 year or as needed We'll notify you of your lab results and make any changes if needed Continue to work on healthy diet and regular exercise- you look great! Call with any questions or concerns Stay Safe! Stay Healthy! Happy Holidays!!!

## 2024-10-08 NOTE — Progress Notes (Signed)
° °  Subjective:    Patient ID: Hannah Ayala, female    DOB: 01-11-1961, 63 y.o.   MRN: [id number]  HPI CPE- UTD on mammo, colonoscopy, Tdap, flu.  Declines PNA, shingles  Health Maintenance  Topic Date Due   COVID-19 Vaccine (4 - 2025-26 season) 10/24/2024 (Originally 06/25/2024)   Zoster Vaccines- Shingrix (1 of 2) 01/06/2025 (Originally 12/17/2012)   Pneumococcal Vaccine: 50+ Years (1 of 1 - PCV) 10/08/2025 (Originally 12/17/2012)   Mammogram  12/15/2024   Colonoscopy  10/08/2027   DTaP/Tdap/Td (3 - Td or Tdap) 03/15/2033   Influenza Vaccine  Completed   Hepatitis C Screening  Completed   HIV Screening  Completed   Hepatitis B Vaccines 19-59 Average Risk  Aged Out   HPV VACCINES  Aged Out   Meningococcal B Vaccine  Aged Out    Patient Care Team    Relationship Specialty Notifications Start End  Mahlon Comer BRAVO, MD PCP - General Family Medicine  10/19/17   Raeanne Shanda SQUIBB, MD (Inactive) Consulting Physician Obstetrics and Gynecology  10/19/17       Review of Systems Patient reports no vision/ hearing changes, adenopathy,fever, weight change,  persistant/recurrent hoarseness , swallowing issues, chest pain, palpitations, edema, persistant/recurrent cough, hemoptysis, dyspnea (rest/exertional/paroxysmal nocturnal), gastrointestinal bleeding (melena, rectal bleeding), abdominal pain, significant heartburn, bowel changes, GU symptoms (dysuria, hematuria, incontinence), Gyn symptoms (abnormal  bleeding, pain),  syncope, focal weakness, memory loss, numbness & tingling, skin/hair/nail changes, abnormal bruising or bleeding, anxiety, or depression.     Objective:   Physical Exam General Appearance:    Alert, cooperative, no distress, appears stated age  Head:    Normocephalic, without obvious abnormality, atraumatic  Eyes:    PERRL, conjunctiva/corneas clear, EOM's intact both eyes  Ears:    Normal TM's and external ear canals, both ears  Nose:   Nares normal, septum  midline, mucosa normal, no drainage    or sinus tenderness  Throat:   Lips, mucosa, and tongue normal; teeth and gums normal  Neck:   Supple, symmetrical, trachea midline, no adenopathy;    Thyroid : no enlargement/tenderness/nodules  Back:     Symmetric, no curvature, ROM normal, no CVA tenderness  Lungs:     Clear to auscultation bilaterally, respirations unlabored  Chest Wall:    No tenderness or deformity   Heart:    Regular rate and rhythm, S1 and S2 normal, no murmur, rub   or gallop  Breast Exam:    Deferred to GYN  Abdomen:     Soft, non-tender, bowel sounds active all four quadrants,    no masses, no organomegaly  Genitalia:    Deferred to GYN  Rectal:    Extremities:   Extremities normal, atraumatic, no cyanosis or edema  Pulses:   2+ and symmetric all extremities  Skin:   Skin color, texture, turgor normal, no rashes or lesions  Lymph nodes:   Cervical, supraclavicular, and axillary nodes normal  Neurologic:   CNII-XII intact, normal strength, sensation and reflexes    throughout          Assessment & Plan:

## 2024-10-08 NOTE — Assessment & Plan Note (Signed)
Pt's PE WNL w/ exception of BMI.  UTD on mammo, colonoscopy, Tdap, flu.  Check labs.  Anticipatory guidance provided.

## 2024-10-09 ENCOUNTER — Ambulatory Visit: Payer: Self-pay | Admitting: Family Medicine

## 2024-10-09 MED ORDER — VITAMIN D (ERGOCALCIFEROL) 1.25 MG (50000 UNIT) PO CAPS: 50000.0000 [IU] | ORAL_CAPSULE | ORAL | 0 refills | Status: AC

## 2024-10-10 ENCOUNTER — Other Ambulatory Visit: Payer: Self-pay

## 2024-10-10 DIAGNOSIS — Z87448 Personal history of other diseases of urinary system: Secondary | ICD-10-CM

## 2024-10-10 NOTE — Progress Notes (Signed)
 Called patient and relayed Dr. Charis message.  Scheduled patient a lab visit for UA  Future ordered UA

## 2024-10-15 ENCOUNTER — Other Ambulatory Visit (INDEPENDENT_AMBULATORY_CARE_PROVIDER_SITE_OTHER)

## 2024-10-15 ENCOUNTER — Ambulatory Visit: Payer: Self-pay | Admitting: Family Medicine

## 2024-10-15 DIAGNOSIS — R3129 Other microscopic hematuria: Secondary | ICD-10-CM

## 2024-10-15 DIAGNOSIS — R319 Hematuria, unspecified: Secondary | ICD-10-CM

## 2024-10-15 LAB — URINALYSIS, ROUTINE W REFLEX MICROSCOPIC
Bilirubin Urine: NEGATIVE
Ketones, ur: NEGATIVE
Nitrite: NEGATIVE
Specific Gravity, Urine: >=1.03 — AB (ref 1.000–1.030)
Total Protein, Urine: NEGATIVE
Urine Glucose: NEGATIVE
Urobilinogen, UA: 0.2 (ref 0.0–1.0)
pH: 6 (ref 5.0–8.0)

## 2024-10-15 LAB — POCT URINALYSIS DIPSTICK
Bilirubin, UA: NEGATIVE
Glucose, UA: NEGATIVE
Ketones, UA: NEGATIVE
Leukocytes, UA: NEGATIVE
Nitrite, UA: NEGATIVE
Protein, UA: NEGATIVE
Spec Grav, UA: >=1.03 — AB
Urobilinogen, UA: 0.2 U/dL
pH, UA: 5.5

## 2024-10-16 ENCOUNTER — Other Ambulatory Visit

## 2024-11-01 ENCOUNTER — Encounter: Payer: Self-pay | Admitting: Student in an Organized Health Care Education/Training Program

## 2024-11-01 ENCOUNTER — Ambulatory Visit: Payer: Self-pay

## 2024-11-01 ENCOUNTER — Ambulatory Visit: Admitting: Student in an Organized Health Care Education/Training Program

## 2024-11-01 VITALS — BP 134/86 | HR 88 | Wt 188.0 lb

## 2024-11-01 DIAGNOSIS — M7989 Other specified soft tissue disorders: Secondary | ICD-10-CM | POA: Diagnosis not present

## 2024-11-01 NOTE — Patient Instructions (Signed)
" °  VISIT SUMMARY: Today, you came in because of swelling and tenderness in your left arm that has been bothering you for a couple of weeks. The swelling is mainly below your elbow, and the veins in your arm appear more raised. You have not had any surgeries or new activities that could explain the swelling, and you have no history of breast cancer or vascular procedures.  YOUR PLAN: -LEFT UPPER LIMB SWELLING: The swelling and tenderness in your left arm could be due to a blood clot in the veins or a backup in the lymphatic system. To find out more, we have ordered an ultrasound of your left arm to check the blood flow in your veins. Additionally, a diagnostic mammogram will be done in the next couple of weeks to rule out any other issues.  -GENERAL HEALTH MAINTENANCE: We discussed your routine health maintenance. Although you have a mammogram scheduled for March, we are planning an earlier diagnostic mammogram due to your current symptoms.  INSTRUCTIONS: Please schedule and complete the ultrasound of your left arm as soon as possible. Also, make sure to get the diagnostic mammogram done in the next couple of weeks. Follow up with us  after these tests are completed to discuss the results and next steps.   "

## 2024-11-01 NOTE — Assessment & Plan Note (Signed)
 Swelling and tenderness are present in the left upper limb, particularly around the forearm and brachioradialis muscle. The differential diagnosis includes venous thrombosis or lymphatic backup, with no signs of atherosclerosis or arterial blockage.  No signs of local muscle rhabdo, no signs of biceps tendon rupture.  An ultrasound of the left arm is ordered to assess venous blood flow and rule out venous thrombosis. A diagnostic mammogram is also ordered.  No history of breast cancer or other procedures in the left axilla to suggest she would have new lymphedema, unless it were to be idiopathic.  If those tests are normal, we will talk with the patient about imaging of the SVC.

## 2024-11-01 NOTE — Telephone Encounter (Signed)
 FYI Only or Action Required?: FYI only for provider: appointment scheduled on This afternoon.  Patient was last seen in primary care on 10/08/2024 by Hannah Comer BRAVO, MD.  Called Nurse Triage reporting Arm Swelling. Sore to the touch  Symptoms began several weeks ago.  Interventions attempted: Nothing.  Symptoms are: Pain/swelling may have moved a little. Pt thought she saw veins in her arm yesterday..  Triage Disposition: See PCP When Office is Open (Within 3 Days)  Patient/caregiver understands and will follow disposition?: Yes                       Copied from CRM #[id number]. Topic: Clinical - Red Word Triage >> Nov 01, 2024  8:26 AM Hannah Ayala wrote: Red Word that prompted transfer to Nurse Triage: Right forearm swollen, sore to touch, can see the veins in it, try to pick up something need other hand to help Reason for Disposition  [1] MODERATE pain (e.g., interferes with normal activities) AND [2] present > 3 days  Protocols used: Arm Pain-A-AH

## 2024-11-01 NOTE — Progress Notes (Signed)
 "  Acute Office Visit  Patient ID: Hannah Ayala, female    DOB: 10-01-1961, 64 y.o.   MRN: [id number]  PCP: Mahlon Comer BRAVO, MD  Chief Complaint  Patient presents with   Arm Pain    Left forearm swollen, sore to touch, can see the veins in it, try to pick up something need other hand to help.      Subjective:     HPI  Discussed the use of AI scribe software for clinical note transcription with the patient, who gave verbal consent to proceed.  History of Present Illness Hannah Ayala is a 64 year old female who presents with left arm swelling and tenderness.  She has experienced swelling and tenderness in her left arm for a couple of weeks. Initially, the tenderness was located in a different area but has since moved. The area is very sore, especially when applying lotion or washing it. The swelling has become more noticeable, with veins appearing more raised, causing concern.  The symptoms are primarily below the elbow, and the initial area of concern is no longer sore. No facial swelling reported by the patient. She has not had any surgeries on her left arm, armpits, or any history of breast cancer or radiation therapies. There is no history of vascular procedures in the upper body.  She initially dismissed the symptoms, thinking she might have bumped it, but the persistence and tenderness have prompted her to seek medical evaluation. Activities like wringing out a washcloth or carrying a heavy purse cause discomfort, particularly when using her left hand.  She has not engaged in any new exercise routines or activities that could have caused the swelling. She enjoys knitting and reports that it does not cause her any discomfort.  She has a history of hematuria for over twenty years, which was previously worked up without significant findings. Her father passed away at the age of 79 due to atherosclerosis, with blockages found in his heart and legs.       Objective:    BP 134/86   Pulse 88   Wt 188 lb (85.3 kg)   SpO2 100%   BMI 30.81 kg/m   Physical Exam  Gen: Well-appearing woman, no facial swelling or plethora Neck: Normal thyroid , no nodules or adenopathy, no JVD Heart: Regular, no murmur Lungs: Unlabored, clear throughout Ext: Left upper extremity is mildly swollen compared to the right.  Circumference around the forearm on the left is 30 cm and 28 cm on the right.  There is no axillary lymphadenopathy.  No pitting edema.  No tenderness to palpation within the forearm, biceps, or triceps compartments. MSK: Left elbow is normal with no effusion, left shoulder is normal with no effusion and normal range of motion's for both.     Assessment & Plan:   Problem List Items Addressed This Visit       Unprioritized   Left arm swelling - Primary   Swelling and tenderness are present in the left upper limb, particularly around the forearm and brachioradialis muscle. The differential diagnosis includes venous thrombosis or lymphatic backup, with no signs of atherosclerosis or arterial blockage.  No signs of local muscle rhabdo, no signs of biceps tendon rupture.  An ultrasound of the left arm is ordered to assess venous blood flow and rule out venous thrombosis. A diagnostic mammogram is also ordered.  No history of breast cancer or other procedures in the left axilla to suggest she would have new lymphedema, unless  it were to be idiopathic.  If those tests are normal, we will talk with the patient about imaging of the SVC.      Relevant Orders   US  Venous Img Upper Uni Left (DVT)   MM Digital Diagnostic Bilat     Return if symptoms worsen or fail to improve.  Cleatus Debby Specking, MD [facility]   "

## 2024-11-02 ENCOUNTER — Ambulatory Visit (HOSPITAL_BASED_OUTPATIENT_CLINIC_OR_DEPARTMENT_OTHER)
Admission: RE | Admit: 2024-11-02 | Discharge: 2024-11-02 | Disposition: A | Source: Ambulatory Visit | Attending: Student in an Organized Health Care Education/Training Program

## 2024-11-02 ENCOUNTER — Telehealth: Payer: Self-pay | Admitting: Family Medicine

## 2024-11-02 DIAGNOSIS — M7989 Other specified soft tissue disorders: Secondary | ICD-10-CM | POA: Diagnosis present

## 2024-11-02 NOTE — Telephone Encounter (Signed)
 No I suspect she is referring to the ultrasound.  This is a Doppler ultrasound of the left upper extremity to rule out DVT.

## 2024-11-02 NOTE — Telephone Encounter (Signed)
 Patient saw you yesterday, I see that you ordered a US  and Mammo? Patient wants to schedule a CT? Am i missing something?

## 2024-11-02 NOTE — Telephone Encounter (Signed)
 Copied from CRM #[id number]. Topic: Clinical - Request for Lab/Test Order >> Nov 02, 2024  9:36 AM Viola F wrote: Reason for CRM: Patient would like a call back to schedule CT scan asap. Please call her at [phone]

## 2024-11-02 NOTE — Telephone Encounter (Signed)
 Called patient to verify. She said that she did not say CT scan. She knows she has an ultrasound today at 6pm. Her question is - will someone call her if its urgent? She doesn't want to be let go if something is wrong

## 2024-11-05 ENCOUNTER — Ambulatory Visit: Payer: Self-pay | Admitting: Student in an Organized Health Care Education/Training Program

## 2024-11-05 DIAGNOSIS — M7989 Other specified soft tissue disorders: Secondary | ICD-10-CM

## 2024-11-05 NOTE — Telephone Encounter (Signed)
 Copied from CRM [id number]. Topic: Referral - Status >> Nov 05, 2024 10:37 AM [name] wrote: Reason for CRM: Patient has not received a call for the mammogram referral she states she is in pain, and if there is any way she can get the information so she can call and set up appointment herself please give her a call regarding that info call back # [phone] >> Nov 05, 2024 10:42 AM Alfonso HERO wrote: Patient just called back about the same thing looking for a different answer. I informed her that someone will call her by end of business day.

## 2024-11-06 ENCOUNTER — Telehealth: Payer: Self-pay | Admitting: Student in an Organized Health Care Education/Training Program

## 2024-11-06 NOTE — Telephone Encounter (Signed)
 Copied from CRM [id number]. Topic: Referral - Status >> Nov 06, 2024  3:21 PM Vena HERO wrote: Reason for CRM: Pt calling to check the status of referral from Dr Jerrell. I assured her that it is being worked on and informed her of our general turn around time. I also informed her that someone will call once completed to give her the information to where she is going.

## 2024-11-07 ENCOUNTER — Encounter: Payer: Self-pay | Admitting: Student in an Organized Health Care Education/Training Program

## 2024-11-07 ENCOUNTER — Telehealth: Payer: Self-pay

## 2024-11-07 NOTE — Telephone Encounter (Signed)
 Patient is calling about mammo. When she called to schedule the mammo, they advised her that a mammo will not show the area on concern. Can you clarify why you want a mammo? Patient would like a call back.   Copied from CRM [id number]. Topic: Referral - Status >> Nov 07, 2024 11:10 AM Charolett L wrote: Reason for CRM: Patient called in to verify referral status and requested a call back

## 2024-11-07 NOTE — Telephone Encounter (Signed)
 Tried to call patient. Unable to get through and lvmtrc.  Please let patient know Dr. Gaylin message and that her CT is at 11/13/2024  1:40 PM at [facility]

## 2024-11-07 NOTE — Telephone Encounter (Signed)
 I would like a mammogram of the left breast to eval for mass, and ultrasound of the left axilla to evaluate for lymphadenopathy.

## 2024-11-07 NOTE — Telephone Encounter (Unsigned)
 Copied from CRM [id number]. Topic: Referral - Question >> Nov 07, 2024 11:13 AM Maisie BROCKS wrote: Reason for CRM: pt called and asked for someone to call her with an update on where her CT scan referral will be sent too. Please advise.

## 2024-11-07 NOTE — Telephone Encounter (Signed)
 Pt has had some difficulty in scheduling mammo due to the facility not understanding the reason for exam.  What should clinical report to the facility

## 2024-11-07 NOTE — Telephone Encounter (Signed)
 Copied from CRM [id number]. Topic: Referral - Question >> Nov 07, 2024 11:47 AM Eva FALCON wrote: Reason for CRM: Pt was calling in, states she was trying to schedule the diagnostic mammogram with imaging, but they don't understand why or how that is going to determine what is cause of the swelling in her arm. She is requesting Dr. Cleatus to reach out to the imaging place and explain why he ordered the diagnostic mammogram. She is also requesting a call back to know how to proceed. [phone].

## 2024-11-07 NOTE — Telephone Encounter (Signed)
 Copied from CRM [id number]. Topic: Referral - Status >> Nov 07, 2024 11:38 AM Revonda D wrote: Reason for CRM: Tatiana with [facility] stated that the received a referral for the pt due to left arm swelling. Kathern stated that this wouldn't be a valid reason since its not concerning the pt's breast or near the breast area.    ----------------------------------------------------------------------- From previous Reason for Contact - Referral Question: Reason for CRM:

## 2024-11-07 NOTE — Telephone Encounter (Signed)
 Mammogram is medically necessary to evaluate underlying cause of arm edema.  Would like a diagnostic mammogram along with axillary ultrasound.  Please ask the schedulers to schedule as ordered.

## 2024-11-08 NOTE — Telephone Encounter (Signed)
 Copied from CRM [id number]. Topic: Clinical - Request for Lab/Test Order >> Nov 08, 2024  8:38 AM Hannah Ayala wrote: Reason for CRM: Patient states she tried to call the imaging to get the mammogram scheduled but they told her the reasoning is not sufficient for them to do the order and that the diagnosis needs to be updated in order to do the imaging. Patient took off of work on 1/20 and would like to have this done the same day as her CT if at all possible.  She would like it noted that she was impacted by the [employer] outage yesterday and this is why Alfredo was unable to reach her.  Please call patient back with any updates on the order. [phone] (home)

## 2024-11-08 NOTE — Telephone Encounter (Signed)
 Okay.  I do not think we are going to resolve this issue of the mammogram by that time.  I recommend going forward with the CT scan alone as scheduled.  That will give us  a lot of information about the cause of the left arm edema, and it will give us  a look for any lymphadenopathy in the axilla.

## 2024-11-08 NOTE — Telephone Encounter (Signed)
 Please see other phone thread about updates

## 2024-11-08 NOTE — Telephone Encounter (Signed)
 Called patient and she had a lot of additional questions. She wants to know why did you order the mammo if we are not going to go through with it now. I answered since we are having issues getting it scheduled, Dr. Jerrell wants to table it and go forward with the CT. I dont think she feels secure with just doing the CT. She wants to get to the root of cause ASAP.

## 2024-11-13 ENCOUNTER — Ambulatory Visit
Admission: RE | Admit: 2024-11-13 | Discharge: 2024-11-13 | Disposition: A | Source: Ambulatory Visit | Attending: Student in an Organized Health Care Education/Training Program

## 2024-11-13 ENCOUNTER — Ambulatory Visit: Admitting: Dermatology

## 2024-11-13 ENCOUNTER — Encounter: Payer: Self-pay | Admitting: Dermatology

## 2024-11-13 VITALS — BP 112/75

## 2024-11-13 DIAGNOSIS — L819 Disorder of pigmentation, unspecified: Secondary | ICD-10-CM

## 2024-11-13 DIAGNOSIS — L83 Acanthosis nigricans: Secondary | ICD-10-CM | POA: Diagnosis not present

## 2024-11-13 DIAGNOSIS — M7989 Other specified soft tissue disorders: Secondary | ICD-10-CM

## 2024-11-13 DIAGNOSIS — L814 Other melanin hyperpigmentation: Secondary | ICD-10-CM

## 2024-11-13 MED ORDER — IOPAMIDOL (ISOVUE-370) INJECTION 76%
150.0000 mL | Freq: Once | INTRAVENOUS | Status: AC | PRN
  Administered 2024-11-13: 150 mL via INTRAVENOUS

## 2024-11-13 MED ORDER — SAFETY SEAL MISCELLANEOUS MISC: 5 refills | Status: AC

## 2024-11-13 MED ORDER — TRETINOIN 0.025 % EX CREA: TOPICAL_CREAM | CUTANEOUS | 0 refills | Status: AC

## 2024-11-13 NOTE — Progress Notes (Signed)
" ° °  New Patient Visit   Subjective  Hannah Ayala is a 64 y.o. female who presents for a NEW PATIENT appointment to be examined for the concerns as listed below.   PIH: Pt stated that she has had PIH around the eyes, mouth and posterior neck that she started noticing Summer 2025. The areas are not bothersome but she explains that she doesn't like the look of it.. She stated that she went to a med spa last summer that recommended the following products:  ReveSkin Lightener w/ 4%hydroquinone, ReveSkin retinol, RevePeel, SkinMedica instant bright eye cream, EltaMD SPF, ZoSkin cleanser, Skin Better repair serum and Skin Better moisture treatment. However, pt stated she is seeing limited results.    Are you nursing, pregnant or trying to conceive? No   The following portions of the chart were reviewed this encounter and updated as appropriate: medications, allergies, medical history  Review of Systems:  No other skin or systemic complaints except as noted in HPI or Assessment and Plan.  Objective  Well appearing patient in no apparent distress; mood and affect are within normal limits.   A focused examination was performed of the following areas: eyes, mouth & neck   Relevant exam findings are noted in the Assessment and Plan.              Assessment & Plan    HYPERPIGMENTATION Exam: hyperpigmented macules and/or patches at eyes, mouth & neck   Chronic hyperpigmentation in the lower face, particularly under the eyes, likely due to shadowing from aging, loss of volume, and vascular congestion. Topical treatments have shown limited improvement (15% over 8 months). Hydroquinone was used briefly before a peel. Sunscreen is important to prevent further discoloration. Retinol and tretinoin  are recommended to stimulate collagen and improve skin cell turnover. Trastuzumab acid is suggested as a safer alternative to hydroquinone to target excess pigment.    Treatment Plan:  -  Rx Medrock Melaxemic Cream - tranexamic acid USP 7% kojic acid USP 2% Vit C USP 2.5% Hyaluronic Acid EXCP 0.1% - apply to face BID morning and night.  - Continue using EltaMD SPF,  Skin Better moisture treatment - D/C ZoSkin cleanser, ReveSkin retinol, RevePeel, SkinMedica instant bright eye cream & ReveSkin Lightener w/ 4%hydroquinone - Samples of LRP gel and cream cleanser provided - Rx Tretinoin  0.025% - apply pea size (1gm) amount to face 1-2 nights a week. Increase to 3 nights once tolerated.    Acanthosis nigricans of the neck Acanthosis nigricans on the neck, associated with prediabetes. Lifestyle modifications, including weight loss and dietary changes, are recommended to prevent progression to diabetes. Tretinoin  is suggested to improve skin texture and pigmentation.  - Apply tretinoin  mixed with SkinBetter moisturizer to the back of the neck two nights a week. - Focus on lifestyle modifications, including weight loss and dietary changes, to address prediabetes.    No follow-ups on file.   Documentation: I have reviewed the above documentation for accuracy and completeness, and I agree with the above.  I, Shirron Maranda, CMA II, am acting as scribe for:   Delon Lenis, DO     "

## 2024-11-13 NOTE — Patient Instructions (Addendum)
 "   VISIT SUMMARY:  During your visit, we discussed your concerns about hyperpigmentation and uneven skin tone on your lower face and under your eyes, as well as the darkness and thickening of the skin on your neck. We reviewed your current skincare regimen and made some adjustments to help improve your skin condition. We also addressed the association of your neck condition with prediabetes and emphasized the importance of lifestyle modifications.  YOUR PLAN:  -FACIAL HYPERPIGMENTATION:  Facial hyperpigmentation is the darkening of the skin, often due to aging, loss of volume, and vascular congestion. To address this, continue using sunscreen to prevent further discoloration.   Morning: Use La Roche-Posay gentle cleanser  Apply [employer] serum .  Apply compounded lightening cream from [employer] to the entire face SkinMedica eye cream around the eyes  Evening: Use La Roche-Posay gentle cleanser [place] compound lightening cream to entire face At night, apply tretinoin  0.025% cream to entire face starting two nights a week and increasing to three nights as tolerated, Apply Roc Retinol Eye Cream to under eyes Apply SkinBetter moisturizer.   Avoid using scrubs on your face while using retinol.  -ACANTHOSIS NIGRICANS OF THE NECK:  Acanthosis nigricans is a skin condition characterized by dark, thickened patches, often associated with prediabetes.   To improve skin texture and pigmentation, apply tretinoin  mixed with SkinBetter moisturizer to the back of your neck two nights a week. Focus on lifestyle modifications, including weight loss and dietary changes, to address prediabetes.  INSTRUCTIONS:  Please continue with your current skincare regimen with the adjustments we discussed. Follow up with your nutritionist for dietary guidance and focus on lifestyle modifications to manage your prediabetic status. If you have any concerns or notice any changes.  please schedule a follow-up  appointment for May  Important Information  Due to recent changes in healthcare laws, you may see results of your pathology and/or laboratory studies on MyChart before the doctors have had a chance to review them. We understand that in some cases there may be results that are confusing or concerning to you. Please understand that not all results are received at the same time and often the doctors may need to interpret multiple results in order to provide you with the best plan of care or course of treatment. Therefore, we ask that you please give us  2 business days to thoroughly review all your results before contacting the office for clarification. Should we see a critical lab result, you will be contacted sooner.   If You Need Anything After Your Visit  If you have any questions or concerns for your doctor, please call our main line at [phone] If no one answers, please leave a voicemail as directed and we will return your call as soon as possible. Messages left after 4 pm will be answered the following business day.   You may also send us  a message via MyChart. We typically respond to MyChart messages within 1-2 business days.  For prescription refills, please ask your pharmacy to contact our office. Our fax number is [phone].  If you have an urgent issue when the clinic is closed that cannot wait until the next business day, you can page your doctor at the number below.    Please note that while we do our best to be available for urgent issues outside of office hours, we are not available 24/7.   If you have an urgent issue and are unable to reach us , you may choose to seek  medical care at your doctor's office, retail clinic, urgent care center, or emergency room.  If you have a medical emergency, please immediately call 911 or go to the emergency department. In the event of inclement weather, please call our main line at [phone] for an update on the status of any delays or  closures.  Dermatology Medication Tips: Please keep the boxes that topical medications come in in order to help keep track of the instructions about where and how to use these. Pharmacies typically print the medication instructions only on the boxes and not directly on the medication tubes.   If your medication is too expensive, please contact our office at [phone] or send us  a message through MyChart.   We are unable to tell what your co-pay for medications will be in advance as this is different depending on your insurance coverage. However, we may be able to find a substitute medication at lower cost or fill out paperwork to get insurance to cover a needed medication.   If a prior authorization is required to get your medication covered by your insurance company, please allow us  1-2 business days to complete this process.  Drug prices often vary depending on where the prescription is filled and some pharmacies may offer cheaper prices.  The website www.goodrx.com contains coupons for medications through different pharmacies. The prices here do not account for what the cost may be with help from insurance (it may be cheaper with your insurance), but the website can give you the price if you did not use any insurance.  - You can print the associated coupon and take it with your prescription to the pharmacy.  - You may also stop by our office during regular business hours and pick up a GoodRx coupon card.  - If you need your prescription sent electronically to a different pharmacy, notify our office through [facility] or by phone at [phone]     "

## 2024-11-14 ENCOUNTER — Telehealth: Payer: Self-pay

## 2024-11-14 NOTE — Telephone Encounter (Unsigned)
 Copied from CRM #[id number]. Topic: Clinical - Lab/Test Results >> Nov 14, 2024  4:41 PM Alfonso HERO wrote: Reason for CRM: patient calling again about CT results. I informed her the doctor will call her when they are in per his message.

## 2024-11-14 NOTE — Telephone Encounter (Signed)
 No, it is not yet available. I will call the patient when it is.

## 2024-11-14 NOTE — Telephone Encounter (Signed)
 Patient is calling to see if CT results are ready   Copied from CRM #[id number]. Topic: Clinical - Lab/Test Results >> Nov 14, 2024 11:29 AM Maisie BROCKS wrote: Reason for CRM: pt called to check if CT results came back yet.

## 2024-11-19 ENCOUNTER — Ambulatory Visit: Payer: Self-pay | Admitting: Student in an Organized Health Care Education/Training Program

## 2024-11-19 DIAGNOSIS — I871 Compression of vein: Secondary | ICD-10-CM

## 2024-11-19 DIAGNOSIS — Z1231 Encounter for screening mammogram for malignant neoplasm of breast: Secondary | ICD-10-CM

## 2024-11-20 ENCOUNTER — Telehealth: Payer: Self-pay | Admitting: Dermatology

## 2024-11-20 ENCOUNTER — Telehealth: Payer: Self-pay

## 2024-11-20 NOTE — Telephone Encounter (Signed)
 Copied from CRM #[id number]. Topic: Clinical - Lab/Test Results >> Nov 20, 2024  1:10 PM Sophia H wrote: Patient is wanting to know if there is any way the diagnostic mammogram can be reordered since it was originally denied. Patient prefers diagnostic if she can get it, also apologizes and states she doesn't want the provider to feel like she is bothering but she really wants to figure out what is going on. Please advise # [phone]

## 2024-11-20 NOTE — Progress Notes (Signed)
 Patient verbalized understanding. Patient stated that her father, the youngest of 45 all passed from some form of cancer. Just wanted to let you know, she is not sure if that is worth mentioning

## 2024-11-20 NOTE — Telephone Encounter (Signed)
 Patient called regarding questions about skin treatment routine. Called patient and reviewed after visit instructions and her skin care regimen. Patient understood instructions and will call back if she has any further questions

## 2024-11-20 NOTE — Telephone Encounter (Signed)
 Sending to Dr. Jerrell

## 2024-11-20 NOTE — Telephone Encounter (Signed)
 I called and spoke with the patient.  Orders are placed in the result notes.

## 2024-11-20 NOTE — Telephone Encounter (Signed)
 Copied from CRM #[id number]. Topic: Clinical - Lab/Test Results >> Nov 20, 2024  9:52 AM Drema MATSU wrote: Reason for CRM: Pt is requesting a callback with imaging results once ready.

## 2024-11-20 NOTE — Telephone Encounter (Signed)
 Imaging impressions are ready at this time, patient would like a call back once comment from provider is available.

## 2024-11-21 NOTE — Progress Notes (Unsigned)
" ° ° °  Chief Complaint: Blood in urine  History of Present Illness:  64 year old female is here today for evaluation and management of microscopic hematuria.  Referred by Comer Greet, MD.     Past Medical History:  Past Medical History:  Diagnosis Date   Allergy    seasonal allergies   Benign colonic polyp    History of chickenpox     Past Surgical History:  Past Surgical History:  Procedure Laterality Date   ROBOTIC ASSISTED LAPAROSCOPIC HYSTERECTOMY AND SALPINGECTOMY     UMBILICAL HERNIA REPAIR     as an infant   WISDOM TOOTH EXTRACTION      Allergies:  Allergies[1]  Family History:  Family History  Problem Relation Age of Onset   Asthma Mother    Hyperlipidemia Mother    Diabetes Father    Hearing loss Father    Hyperlipidemia Father    Hypertension Father    Prostate cancer Father        12's   Asthma Maternal Grandmother    Diabetes Maternal Grandmother    Heart disease Maternal Grandfather    Heart attack Maternal Grandfather    Early death Paternal Grandmother    Esophageal cancer Paternal Aunt    Colon polyps Neg Hx    Colon cancer Neg Hx    Stomach cancer Neg Hx    Rectal cancer Neg Hx     Social History:  Social History[2]  Review of symptoms:  Constitutional:  Negative for unexplained weight loss, night sweats, fever, chills ENT:  Negative for nose bleeds, sinus pain, painful swallowing CV:  Negative for chest pain, shortness of breath, exercise intolerance, palpitations, loss of consciousness Resp:  Negative for cough, wheezing, shortness of breath GI:  Negative for nausea, vomiting, diarrhea, bloody stools GU:  Positives noted in HPI; otherwise negative for gross hematuria, dysuria, urinary incontinence Neuro:  Negative for seizures, poor balance, limb weakness, slurred speech Psych:  Negative for lack of energy, depression, anxiety Endocrine:  Negative for polydipsia, polyuria, symptoms of hypoglycemia (dizziness, hunger,  sweating) Hematologic:  Negative for anemia, purpura, petechia, prolonged or excessive bleeding, use of anticoagulants  Allergic:  Negative for difficulty breathing or choking as a result of exposure to anything; no shellfish allergy; no allergic response (rash/itch) to materials, foods  Physical exam: There were no vitals taken for this visit. GENERAL APPEARANCE:  Well appearing, well developed, well nourished, NAD HEENT: Atraumatic, Normocephalic. NECK: Normal appearance LUNGS: Normal inspiratory and expiratory excursion HEART: Regular Rate ABDOMEN: *** EXTREMITIES: Moves all extremities well.  Without clubbing, cyanosis, or edema. NEUROLOGIC:  Alert and oriented x 3, normal gait, CN II-XII grossly intact.  MENTAL STATUS:  Appropriate. SKIN:  Warm, dry and intact.    Results: No results found for this or any previous visit (from the past 24 hours).  I have reviewed prior patient's records  I have reviewed referring/prior physicians records  Prior urinalyses reviewed-patient with microscopic hematuria dating back to 2019.  I have reviewed prior urine cultures  I reviewed prior imaging studies  Assessment: No diagnosis found.   Plan: ***     [1]  Allergies Allergen Reactions   Penicillin G Other (See Comments)    uknown  [2]  Social History Tobacco Use   Smoking status: Never   Smokeless tobacco: Never  Vaping Use   Vaping status: Never Used  Substance Use Topics   Alcohol use: No   Drug use: No   "

## 2024-11-26 ENCOUNTER — Ambulatory Visit: Admitting: Urology

## 2024-11-26 DIAGNOSIS — R3129 Other microscopic hematuria: Secondary | ICD-10-CM

## 2024-11-29 ENCOUNTER — Telehealth: Payer: Self-pay

## 2024-11-29 NOTE — Telephone Encounter (Signed)
 Per verbal conversation with Dr. Alm she recommends [facility] or [address].  Pt has been informed.

## 2024-11-29 NOTE — Telephone Encounter (Signed)
 Pt called in regard to last OV on 11/13/24  At the OV she brought her at home products that she is using with her moisturizer being Skin Better brand. She has now ran out and would like a recommendation for a replacement before she purchased more as it does have a higher price point.

## 2024-11-30 ENCOUNTER — Ambulatory Visit

## 2024-11-30 ENCOUNTER — Ambulatory Visit: Admission: RE | Admit: 2024-11-30 | Source: Ambulatory Visit

## 2024-11-30 DIAGNOSIS — Z1231 Encounter for screening mammogram for malignant neoplasm of breast: Secondary | ICD-10-CM

## 2024-12-06 ENCOUNTER — Ambulatory Visit (HOSPITAL_COMMUNITY)

## 2024-12-19 ENCOUNTER — Ambulatory Visit: Admitting: Family Medicine

## 2024-12-31 ENCOUNTER — Ambulatory Visit: Admitting: Urology

## 2025-02-25 ENCOUNTER — Ambulatory Visit: Admitting: Dermatology

## 2025-10-09 ENCOUNTER — Encounter: Admitting: Family Medicine
